# Patient Record
Sex: Female | Born: 1975 | Hispanic: Yes | Marital: Married | Smoking: Never smoker
Health system: Southern US, Community
[De-identification: ages and names within clinical notes are randomized; demographics above are authoritative.]

## PROBLEM LIST (undated history)

## (undated) DIAGNOSIS — K648 Other hemorrhoids: Secondary | ICD-10-CM

## (undated) DIAGNOSIS — K8020 Calculus of gallbladder without cholecystitis without obstruction: Secondary | ICD-10-CM

## (undated) DIAGNOSIS — T8859XA Other complications of anesthesia, initial encounter: Secondary | ICD-10-CM

## (undated) DIAGNOSIS — Z9889 Other specified postprocedural states: Secondary | ICD-10-CM

## (undated) DIAGNOSIS — A048 Other specified bacterial intestinal infections: Secondary | ICD-10-CM

## (undated) DIAGNOSIS — D649 Anemia, unspecified: Secondary | ICD-10-CM

## (undated) DIAGNOSIS — F419 Anxiety disorder, unspecified: Secondary | ICD-10-CM

## (undated) DIAGNOSIS — J45909 Unspecified asthma, uncomplicated: Secondary | ICD-10-CM

## (undated) DIAGNOSIS — C50919 Malignant neoplasm of unspecified site of unspecified female breast: Secondary | ICD-10-CM

## (undated) HISTORY — DX: Anemia, unspecified: D64.9

## (undated) HISTORY — DX: Unspecified asthma, uncomplicated: J45.909

## (undated) HISTORY — DX: Calculus of gallbladder without cholecystitis without obstruction: K80.20

## (undated) HISTORY — PX: MASTECTOMY: SHX3

## (undated) HISTORY — DX: Anxiety disorder, unspecified: F41.9

## (undated) HISTORY — PX: OTHER SURGICAL HISTORY: SHX169

## (undated) HISTORY — DX: Other hemorrhoids: K64.8

## (undated) HISTORY — DX: Other specified bacterial intestinal infections: A04.8

## (undated) HISTORY — PX: TUBAL LIGATION: SHX77

---

## 2019-07-20 ENCOUNTER — Other Ambulatory Visit: Payer: Self-pay

## 2019-07-20 ENCOUNTER — Emergency Department (HOSPITAL_BASED_OUTPATIENT_CLINIC_OR_DEPARTMENT_OTHER)
Admission: EM | Admit: 2019-07-20 | Discharge: 2019-07-21 | Disposition: A | Discharge status: Home / Self Care | Payer: Self-pay | Attending: Emergency Medicine | Admitting: Emergency Medicine

## 2019-07-20 ENCOUNTER — Emergency Department (HOSPITAL_BASED_OUTPATIENT_CLINIC_OR_DEPARTMENT_OTHER): Payer: Self-pay

## 2019-07-20 ENCOUNTER — Encounter (HOSPITAL_BASED_OUTPATIENT_CLINIC_OR_DEPARTMENT_OTHER): Payer: Self-pay | Admitting: Emergency Medicine

## 2019-07-20 DIAGNOSIS — R0789 Other chest pain: Secondary | ICD-10-CM | POA: Insufficient documentation

## 2019-07-20 DIAGNOSIS — M62830 Muscle spasm of back: Secondary | ICD-10-CM | POA: Insufficient documentation

## 2019-07-20 DIAGNOSIS — R079 Chest pain, unspecified: Secondary | ICD-10-CM

## 2019-07-20 HISTORY — DX: Malignant neoplasm of unspecified site of unspecified female breast: C50.919

## 2019-07-20 MED ORDER — KETOROLAC TROMETHAMINE 15 MG/ML IJ SOLN
15.0000 mg | Freq: Once | INTRAMUSCULAR | Status: AC
  Administered 2019-07-20: 15 mg via INTRAVENOUS
  Filled 2019-07-20: qty 1

## 2019-07-20 NOTE — ED Provider Notes (Signed)
[facility] DEPARTMENT Provider Note  CSN: [id number] Arrival date & time: 07/20/19 2311  Chief Complaint(s) Chest Pain  HPI Ann Andrade is a 44 y.o. female   CC: Chest pain  Onset/Duration: Sudden, 1 hour ago Timing: Constant, unchanged Location: Left upper chest/upper left back/shoulder Quality: Cramping/aching Severity: Severe Modifying Factors:  Improved by: Lying still  Worsened by: Palpation of the left upper chest and shoulder girdle muscles, range of motion of the left shoulder, breathing, movement Associated Signs/Symptoms:  Pertinent (+): None  Pertinent (-): Fevers, chills, nausea, vomiting, abdominal pain, shortness of breath, leg swelling. Context: Patient reports that the incident began after she stepped out of the shower and was bending down to put on lotion.  She reports feeling her chest and shoulder cramp.    HPI  Past Medical History Past Medical History:  Diagnosis Date   Breast cancer ([place])    There are no problems to display for this patient.  Home Medication(s) Prior to Admission medications   Not on File                                                                                                                                    Past Surgical History Past Surgical History:  Procedure Laterality Date   MASTECTOMY     Family History History reviewed. No pertinent family history.  Social History Social History   Tobacco Use   Smoking status: Never Smoker   Smokeless tobacco: Never Used  Substance Use Topics   Alcohol use: Not on file   Drug use: Not on file   Allergies Patient has no known allergies.  Review of Systems Review of Systems All other systems are reviewed and are negative for acute change except as noted in the HPI  Physical Exam Vital Signs  I have reviewed the triage vital signs BP 106/79 (BP Location: Right Arm)    Pulse 79    Temp 98.6 F (37 C) (Oral)    Resp (!) 22    Ht 5\' 4"   (1.626 m)    Wt 81.2 kg    LMP  (LMP Unknown)    SpO2 100%    BMI 30.73 kg/m   Physical Exam Vitals reviewed.  Constitutional:      General: She is not in acute distress.    Appearance: She is well-developed. She is not diaphoretic.  HENT:     Head: Normocephalic and atraumatic.     Nose: Nose normal.  Eyes:     General: No scleral icterus.       Right eye: No discharge.        Left eye: No discharge.     Conjunctiva/sclera: Conjunctivae normal.     Pupils: Pupils are equal, round, and reactive to light.  Cardiovascular:     Rate and Rhythm: Normal rate and regular rhythm.     Heart sounds: No murmur heard.  No  friction rub. No gallop.   Pulmonary:     Effort: Pulmonary effort is normal. No respiratory distress.     Breath sounds: Normal breath sounds. No stridor. No rales.  Chest:     Chest wall: Tenderness (exquisite) present.    Abdominal:     General: There is no distension.     Palpations: Abdomen is soft.     Tenderness: There is no abdominal tenderness.  Musculoskeletal:     Cervical back: Normal range of motion and neck supple. Spasms and tenderness present. No bony tenderness.       Back:  Skin:    General: Skin is warm and dry.     Findings: No erythema or rash.  Neurological:     Mental Status: She is alert and oriented to person, place, and time.     ED Results and Treatments Labs (all labs ordered are listed, but only abnormal results are displayed) Labs Reviewed - No data to display                                                                                                                       EKG  EKG Interpretation  Date/Time:  Saturday July 20 2019 23:13:25 EDT Ventricular Rate:  69 PR Interval:    QRS Duration: 82 QT Interval:  376 QTC Calculation: 403 R Axis:   53 Text Interpretation: Sinus rhythm Consider left atrial enlargement Abnormal R-wave progression, early transition NO STEMI. No old tracing to compare Confirmed by Ann Andrade [phone]) on 07/20/2019 11:21:31 PM      Radiology DG Chest 2 View  Result Date: 07/21/2019 CLINICAL DATA:  Acute onset of sternal chest pain. EXAM: CHEST - 2 VIEW COMPARISON:  None. FINDINGS: The cardiomediastinal contours are normal. The lungs are clear. Pulmonary vasculature is normal. No consolidation, pleural effusion, or pneumothorax. No acute osseous abnormalities are seen. IMPRESSION: Negative radiographs of the chest. Electronically Signed   By: Ann Andrade M.D.   On: 07/21/2019 00:04    Pertinent labs & imaging results that were available during my care of the patient were reviewed by me and considered in my medical decision making (see chart for details).  Medications Ordered in ED Medications  ketorolac (TORADOL) 15 MG/ML injection 15 mg (15 mg Intravenous Given 07/20/19 2354)  Procedures Procedures  (including critical care time)  Medical Decision Making / ED Course I have reviewed the nursing notes for this encounter and the patient's prior records (if available in EHR or on provided paperwork).   Ann Andrade was evaluated in Emergency Department on 07/21/2019 for the symptoms described in the history of present illness. She was evaluated in the context of the global COVID-19 pandemic, which necessitated consideration that the patient might be at risk for infection with the SARS-CoV-2 virus that causes COVID-19. Institutional protocols and algorithms that pertain to the evaluation of patients at risk for COVID-19 are in a state of rapid change based on information released by regulatory bodies including the CDC and federal and state organizations. These policies and algorithms were followed during the patient's care in the ED.  Sudden onset left upper chest/shoulder girdle pain most consistent with muscle spasm.  Highly consistent with ACS.  EKG  without acute ischemic changes or evidence of pericarditis.  Do not feel that cardiac markers are necessary at this time  Patient does have a history of prior breast cancer status post mastectomy which she reports was a complete resection without need for chemotherapy.  Patient is on Depo, no OCPs. No recent travel. No leg swelling or pain.  I have considered PE given her past history but feel this is highly unlikely given her current symptom constellation and presentation..  Chest x-ray without evidence suggestive of pneumonia, pneumothorax, pneumomediastinum.  No abnormal contour of the mediastinum to suggest dissection. No evidence of acute injuries.  Presentation not classic for aortic dissection or esophageal perforation.  We will treat for muscle spasm/strain with IV Toradol and heat packs.  Will reassess for improvement.  Patient had near complete resolution of her pain with the above treatment.     Final Clinical Impression(s) / ED Diagnoses Final diagnoses:  Chest pain   The patient appears reasonably screened and/or stabilized for discharge and I doubt any other medical condition or other [facility] requiring further screening, evaluation, or treatment in the ED at this time prior to discharge. Safe for discharge with strict return precautions.  Disposition: Discharge  Condition: Good  I have discussed the results, Dx and Tx plan with the patient/family who expressed understanding and agree(s) with the plan. Discharge instructions discussed at length. The patient/family was given strict return precautions who verbalized understanding of the instructions. No further questions at time of discharge.    ED Discharge Orders    None        Follow Up: Primary care provider  Schedule an appointment as soon as possible for a visit  If you do not have a primary care physician, contact [facility] at [phone] for referral      This chart was dictated using voice recognition  software.  Despite best efforts to proofread,  errors can occur which can change the documentation meaning.   Fatima Blank, MD 07/21/19 Ben[place]io Deeds

## 2019-07-20 NOTE — ED Triage Notes (Addendum)
Centralized non radiating chest pain started after getting out of the shower this evening.  Denies sob or cold symptoms.  Pt states pain is severe.  Pt does admit to having difficulty taking a deep breath.  No recent travel but is on depo.  Pt states she ate pizza for dinner at 7.

## 2019-07-21 NOTE — Discharge Instructions (Addendum)
Puede tomar Motrin (Ibuprofen) o Aleve (Naproxen), Acetaminophen (Tylenol), crema para los musculos como SalonPas, Icy Hot, [place]ert, Social research officer, government. Puede estirar, ponerce hielo o comprecion de calor, o que le den [place].

## 2020-09-18 ENCOUNTER — Other Ambulatory Visit: Payer: Self-pay

## 2020-09-18 ENCOUNTER — Emergency Department (HOSPITAL_BASED_OUTPATIENT_CLINIC_OR_DEPARTMENT_OTHER): Payer: Self-pay

## 2020-09-18 ENCOUNTER — Encounter (HOSPITAL_BASED_OUTPATIENT_CLINIC_OR_DEPARTMENT_OTHER): Payer: Self-pay | Admitting: *Deleted

## 2020-09-18 ENCOUNTER — Emergency Department (HOSPITAL_BASED_OUTPATIENT_CLINIC_OR_DEPARTMENT_OTHER)
Admission: EM | Admit: 2020-09-18 | Discharge: 2020-09-18 | Disposition: A | Discharge status: Home / Self Care | Payer: Self-pay | Attending: Emergency Medicine | Admitting: Emergency Medicine

## 2020-09-18 DIAGNOSIS — N3000 Acute cystitis without hematuria: Secondary | ICD-10-CM | POA: Insufficient documentation

## 2020-09-18 DIAGNOSIS — M546 Pain in thoracic spine: Secondary | ICD-10-CM | POA: Insufficient documentation

## 2020-09-18 DIAGNOSIS — D72829 Elevated white blood cell count, unspecified: Secondary | ICD-10-CM | POA: Insufficient documentation

## 2020-09-18 DIAGNOSIS — Z20822 Contact with and (suspected) exposure to covid-19: Secondary | ICD-10-CM | POA: Insufficient documentation

## 2020-09-18 DIAGNOSIS — Z853 Personal history of malignant neoplasm of breast: Secondary | ICD-10-CM | POA: Insufficient documentation

## 2020-09-18 LAB — CBC WITH DIFFERENTIAL/PLATELET
Abs Immature Granulocytes: 0.02 10*3/uL (ref 0.00–0.07)
Basophils Absolute: 0 10*3/uL (ref 0.0–0.1)
Basophils Relative: 0 %
Eosinophils Absolute: 0.1 10*3/uL (ref 0.0–0.5)
Eosinophils Relative: 2 %
HCT: 36.4 % (ref 36.0–46.0)
Hemoglobin: 12.4 g/dL (ref 12.0–15.0)
Immature Granulocytes: 0 %
Lymphocytes Relative: 24 %
Lymphs Abs: 1.1 10*3/uL (ref 0.7–4.0)
MCH: 30.6 pg (ref 26.0–34.0)
MCHC: 34.1 g/dL (ref 30.0–36.0)
MCV: 89.9 fL (ref 80.0–100.0)
Monocytes Absolute: 0.5 10*3/uL (ref 0.1–1.0)
Monocytes Relative: 10 %
Neutro Abs: 2.9 10*3/uL (ref 1.7–7.7)
Neutrophils Relative %: 64 %
Platelets: 186 10*3/uL (ref 150–400)
RBC: 4.05 MIL/uL (ref 3.87–5.11)
RDW: 11.3 % — ABNORMAL LOW (ref 11.5–15.5)
WBC: 4.6 10*3/uL (ref 4.0–10.5)
nRBC: 0 % (ref 0.0–0.2)

## 2020-09-18 LAB — COMPREHENSIVE METABOLIC PANEL
ALT: 16 U/L (ref 0–44)
AST: 18 U/L (ref 15–41)
Albumin: 3.7 g/dL (ref 3.5–5.0)
Alkaline Phosphatase: 45 U/L (ref 38–126)
Anion gap: 6 (ref 5–15)
BUN: 12 mg/dL (ref 6–20)
CO2: 23 mmol/L (ref 22–32)
Calcium: 8.9 mg/dL (ref 8.9–10.3)
Chloride: 107 mmol/L (ref 98–111)
Creatinine, Ser: 0.54 mg/dL (ref 0.44–1.00)
GFR, Estimated: >60 mL/min (ref >60)
Glucose, Bld: 99 mg/dL (ref 70–99)
Potassium: 4 mmol/L (ref 3.5–5.1)
Sodium: 136 mmol/L (ref 135–145)
Total Bilirubin: 0.4 mg/dL (ref 0.3–1.2)
Total Protein: 6.8 g/dL (ref 6.5–8.1)

## 2020-09-18 LAB — LIPASE, BLOOD: Lipase: 45 U/L (ref 11–51)

## 2020-09-18 LAB — RESP PANEL BY RT-PCR (FLU A&B, COVID) ARPGX2
Influenza A by PCR: NEGATIVE
Influenza B by PCR: NEGATIVE
SARS Coronavirus 2 by RT PCR: NEGATIVE

## 2020-09-18 LAB — URINALYSIS, ROUTINE W REFLEX MICROSCOPIC
Bilirubin Urine: NEGATIVE
Glucose, UA: NEGATIVE mg/dL
Ketones, ur: NEGATIVE mg/dL
Nitrite: NEGATIVE
Protein, ur: NEGATIVE mg/dL
Specific Gravity, Urine: 1.015 (ref 1.005–1.030)
pH: 8 (ref 5.0–8.0)

## 2020-09-18 LAB — PREGNANCY, URINE: Preg Test, Ur: NEGATIVE

## 2020-09-18 LAB — URINALYSIS, MICROSCOPIC (REFLEX)

## 2020-09-18 MED ORDER — IOHEXOL 350 MG/ML SOLN
100.0000 mL | Freq: Once | INTRAVENOUS | Status: AC | PRN
  Administered 2020-09-18: 100 mL via INTRAVENOUS

## 2020-09-18 MED ORDER — ACETAMINOPHEN 500 MG PO TABS
1000.0000 mg | ORAL_TABLET | Freq: Once | ORAL | Status: AC
  Administered 2020-09-18: 1000 mg via ORAL
  Filled 2020-09-18: qty 2

## 2020-09-18 MED ORDER — CEPHALEXIN 500 MG PO CAPS
500.0000 mg | ORAL_CAPSULE | Freq: Two times a day (BID) | ORAL | 0 refills | Status: AC
Start: 2020-09-18 — End: 2020-09-25

## 2020-09-18 NOTE — ED Notes (Signed)
Patient transported to CT 

## 2020-09-18 NOTE — ED Provider Notes (Signed)
[facility] Provider Note   CSN: [id number] Arrival date & time: 09/18/20  1805     History Chief Complaint  Patient presents with   URI    Ann Andrade is a 45 y.o. female with a history of breast cancer.  Patient reports that breast cancer occurred 22 years prior, and is not receiving any chemotherapy or radiation.  Presents with a chief complaint of body aches, chills, and lower abdominal pain.  Patient reports that her symptoms started yesterday.  Patient reports that abdominal pain has been constant.  Patient rates pain 10/10 on the pain scale.  Patient denies any aggravating factors.  Patient has had minimal relief with ibuprofen.  Additionally patient complains of thoracic back pain.  Patient denies any recent falls, injuries, or heavy lifting.  Patient endorses subjective fevers, urinary frequency, and nausea.  Patient denies any rhinorrhea, nasal congestion, sore throat, cough, shortness of breath, chest pain, blood in stool, melena, constipation, diarrhea, dysuria, hematuria, vaginal pain, vaginal bleeding, vaginal discharge, genital sores or lesions.  Patient reports that she does not get regular menstrual periods due to Depo-Provera shot.  Patient is sexually active in a mutually monogamous relationship with a female partner.  Patient was offered Spanish interpreter for this interview however declined.   URI Presenting symptoms: fever (Subjective)   Presenting symptoms: no congestion, no rhinorrhea and no sore throat   Associated symptoms: myalgias (Generalized)   Associated symptoms: no headaches and no neck pain       Past Medical History:  Diagnosis Date   Breast cancer ([place])     There are no problems to display for this patient.   Past Surgical History:  Procedure Laterality Date   MASTECTOMY       OB History   No obstetric history on file.     No family history on file.  Social History   Tobacco Use   Smoking status:  Never   Smokeless tobacco: Never  Vaping Use   Vaping Use: Never used  Substance Use Topics   Alcohol use: Never   Drug use: Never    Home Medications Prior to Admission medications   Not on File    Allergies    Patient has no known allergies.  Review of Systems   Review of Systems  Constitutional:  Positive for chills and fever (Subjective).  HENT:  Negative for congestion, rhinorrhea and sore throat.   Eyes:  Negative for visual disturbance.  Respiratory:  Negative for shortness of breath.   Cardiovascular:  Negative for chest pain.  Gastrointestinal:  Positive for abdominal pain and nausea. Negative for abdominal distention, anal bleeding, blood in stool, constipation, diarrhea, rectal pain and vomiting.  Genitourinary:  Positive for frequency and urgency. Negative for difficulty urinating, dysuria, flank pain, genital sores, hematuria, pelvic pain, vaginal bleeding, vaginal discharge and vaginal pain.  Musculoskeletal:  Positive for back pain and myalgias (Generalized). Negative for neck pain.  Skin:  Negative for color change and rash.  Neurological:  Negative for dizziness, syncope, light-headedness and headaches.  Psychiatric/Behavioral:  Negative for confusion.    Physical Exam Updated Vital Signs BP 98/67 (BP Location: Right Arm)   Pulse 66   Temp 98.2 F (36.8 C) (Oral)   Resp 16   Ht '5\' 4"'$  (1.626 m)   Wt 81.6 kg   SpO2 100%   BMI 30.90 kg/m   Physical Exam Vitals and nursing note reviewed.  Constitutional:      General: She is  not in acute distress.    Appearance: She is not ill-appearing, toxic-appearing or diaphoretic.  HENT:     Head: Normocephalic.  Eyes:     General: No scleral icterus.       Right eye: No discharge.        Left eye: No discharge.  Cardiovascular:     Rate and Rhythm: Normal rate.  Pulmonary:     Effort: Pulmonary effort is normal. No tachypnea, bradypnea or respiratory distress.     Breath sounds: Normal breath sounds. No  stridor.  Abdominal:     General: A surgical scar is present. Bowel sounds are normal. There is no distension. There are no signs of injury.     Palpations: Abdomen is soft. There is no mass or pulsatile mass.     Tenderness: There is abdominal tenderness. There is no right CVA tenderness, left CVA tenderness, guarding or rebound.     Hernia: There is no hernia in the umbilical area.     Comments: Mild lower abdominal tenderness with no peritoneal signs.  Well-healed midline surgical scar.  Musculoskeletal:     Cervical back: Normal.     Thoracic back: Tenderness present. No swelling, edema, deformity, signs of trauma, spasms or bony tenderness.     Lumbar back: No swelling, edema, deformity, signs of trauma, lacerations, spasms, tenderness or bony tenderness.     Comments: No midline tenderness or deformity to cervical, thoracic, or lumbar spine.  Patient has tenderness to bilateral thoracic back.  Skin:    General: Skin is warm and dry.  Neurological:     General: No focal deficit present.     Mental Status: She is alert.  Psychiatric:        Behavior: Behavior is cooperative.    ED Results / Procedures / Treatments   Labs (all labs ordered are listed, but only abnormal results are displayed) Labs Reviewed  URINALYSIS, ROUTINE W REFLEX MICROSCOPIC - Abnormal; Notable for the following components:      Result Value   APPearance HAZY (*)    Hgb urine dipstick SMALL (*)    Leukocytes,Ua MODERATE (*)    All other components within normal limits  CBC WITH DIFFERENTIAL/PLATELET - Abnormal; Notable for the following components:   RDW 11.3 (*)    All other components within normal limits  URINALYSIS, MICROSCOPIC (REFLEX) - Abnormal; Notable for the following components:   Bacteria, UA RARE (*)    All other components within normal limits  RESP PANEL BY RT-PCR (FLU A&B, COVID) ARPGX2  PREGNANCY, URINE  COMPREHENSIVE METABOLIC PANEL  LIPASE, BLOOD    EKG None  Radiology CT  ABDOMEN PELVIS W CONTRAST  Result Date: 09/18/2020 CLINICAL DATA:  Diverticulitis, rhinorrhea, chills, myalgia EXAM: CT ABDOMEN AND PELVIS WITH CONTRAST TECHNIQUE: Multidetector CT imaging of the abdomen and pelvis was performed using the standard protocol following bolus administration of intravenous contrast. CONTRAST:  174m OMNIPAQUE IOHEXOL 350 MG/ML SOLN COMPARISON:  None. FINDINGS: Lower chest: No acute abnormality. Hepatobiliary: Cholelithiasis without pericholecystic inflammatory change. Mild focal fatty hepatic infiltration adjacent to the falciform ligament. No enhancing intrahepatic mass. No intra or extrahepatic biliary ductal dilation. Pancreas: Unremarkable Spleen: Unremarkable Adrenals/Urinary Tract: Adrenal glands are unremarkable. Kidneys are normal, without renal calculi, focal lesion, or hydronephrosis. The bladder is decompressed. There is circumferential bladder wall thickening, however, which may reflect a superimposed diffuse infectious or inflammatory cystitis. Stomach/Bowel: Stomach is within normal limits. Appendix appears normal. No evidence of bowel wall thickening, distention, or inflammatory  changes. No free intraperitoneal gas or fluid. Vascular/Lymphatic: No significant vascular findings are present. No enlarged abdominal or pelvic lymph nodes. Reproductive: Uterus and bilateral adnexa are unremarkable. Other: Surgical changes of a left breast TRAM flap reconstruction is identified. A 2.8 x 4.5 cm loculated fluid collection is seen within the anterior abdominal wall likely representing a postoperative seroma. No abdominal wall hernia identified. Rectum unremarkable. Musculoskeletal: No acute bone abnormality. No lytic or blastic bone lesions. IMPRESSION: Circumferential bladder wall thickening, possibly reflecting a diffuse infectious or inflammatory cystitis. Correlation with urinalysis and urine culture may be helpful. The bladder is not distended. Cholelithiasis. Surgical changes  of left breast TRAM flap reconstruction with probable 4.5 cm postoperative seroma within the anterior abdominal wall. Electronically Signed   By: Fidela Salisbury M.D.   On: 09/18/2020 22:51    Procedures Procedures   Medications Ordered in ED Medications  acetaminophen (TYLENOL) tablet 1,000 mg (1,000 mg Oral Given 09/18/20 2010)  iohexol (OMNIPAQUE) 350 MG/ML injection 100 mL (100 mLs Intravenous Contrast Given 09/18/20 2224)    ED Course  I have reviewed the triage vital signs and the nursing notes.  Pertinent labs & imaging results that were available during my care of the patient were reviewed by me and considered in my medical decision making (see chart for details).    MDM Rules/Calculators/A&P                           Alert 45 year old female no acute distress, nontoxic appearing.  Presents with chief complaint of chills, generalized myalgias, lower abdominal pain, and thoracic back pain.  Patient endorses associated urinary frequency and nausea.  Abdomen soft, nondistended, mild tenderness to lower abdomen with no peritoneal signs.  Negative CVA tenderness bilaterally.  Due to patient's reports of chills, subjective fever, and generalized myalgias we will swab patient for COVID-19 and influenza.  Respiratory panel negative for COVID-19 and influenza.  The patient's abdominal tenderness, thoracic back pain, and urinary frequency concern for pyelonephritis, renal calculus, appendicitis, diverticulitis.  Will obtain CMP, CBC, lipase, urinalysis, urine pregnancy test.  Urine pregnancy test negative Lipase within normal limits CMP and CBC unremarkable UA shows bacteria rare, WBC 21-50, leukocytes moderate.  On repeat examination patient reports improvement in her pain after receiving Tylenol however still has mild tenderness to lower abdomen.  Will obtain CT imaging.  CT imaging shows cholelithiasis, circumferential bladder wall thickening, and 4.5 cm postoperative seroma within  the anterior abdominal wall.  Low suspicion for cholangitis as patient is afebrile, no inflammatory changes on CT imaging, LFTs within normal limits.  Low suspicion for seroma as cause of patient's abdominal pain is surgery is reported to be 22 years prior.  Due to circumferential bladder wall thickening and urinalysis findings combined with patient's urinary frequency and urgency suspect possible urinary tract infection.  Suspicion for pyelonephritis as no abnormalities are seen to kidneys on CT imaging.  Will start patient on 7-day course of Keflex.  Patient to follow-up with primary care provider as needed.  The patient appears stable so that the remainder of the work up may be completed by another provider.         Final Clinical Impression(s) / ED Diagnoses Final diagnoses:  Acute cystitis without hematuria    Rx / DC Orders ED Discharge Orders          Ordered    cephALEXin (KEFLEX) 500 MG capsule  2 times daily  09/18/20 2320             Loni Beckwith, PA-C 09/19/20 0127    Charlesetta Shanks, MD 09/19/20 1442

## 2020-09-18 NOTE — ED Notes (Signed)
Patient left ED with ABCs intact, alert and oriented x4, respirations even and unlabored. Discharge instructions reviewed and all questions answered.   

## 2020-09-18 NOTE — ED Triage Notes (Signed)
Runny nose, chills, body aches since yesterday. She took Ibuprofen today.

## 2020-09-18 NOTE — Discharge Instructions (Addendum)
The findings of your CT scan and urinalysis are concerning for possible urinary tract infection.  Due to this she was started on the antibiotic Keflex.  Please take this medication as prescribed.  Please follow-up with your primary care provider if your symptoms do not improve.  Get help right away if: You have worsening pain in your back or your lower abdomen. You have a fever or chills. You have nausea or vomiting.

## 2021-01-11 ENCOUNTER — Emergency Department (HOSPITAL_BASED_OUTPATIENT_CLINIC_OR_DEPARTMENT_OTHER)
Admission: EM | Admit: 2021-01-11 | Discharge: 2021-01-11 | Disposition: A | Discharge status: Home / Self Care | Payer: Medicaid Other | Attending: Emergency Medicine | Admitting: Emergency Medicine

## 2021-01-11 ENCOUNTER — Encounter (HOSPITAL_BASED_OUTPATIENT_CLINIC_OR_DEPARTMENT_OTHER): Payer: Self-pay | Admitting: *Deleted

## 2021-01-11 ENCOUNTER — Other Ambulatory Visit: Payer: Self-pay

## 2021-01-11 DIAGNOSIS — R300 Dysuria: Secondary | ICD-10-CM | POA: Insufficient documentation

## 2021-01-11 DIAGNOSIS — K921 Melena: Secondary | ICD-10-CM | POA: Insufficient documentation

## 2021-01-11 DIAGNOSIS — Z853 Personal history of malignant neoplasm of breast: Secondary | ICD-10-CM | POA: Insufficient documentation

## 2021-01-11 LAB — PROTIME-INR
INR: 1 (ref 0.8–1.2)
Prothrombin Time: 12.7 seconds (ref 11.4–15.2)

## 2021-01-11 LAB — CBC WITH DIFFERENTIAL/PLATELET
Abs Immature Granulocytes: 0.02 10*3/uL (ref 0.00–0.07)
Basophils Absolute: 0 10*3/uL (ref 0.0–0.1)
Basophils Relative: 1 %
Eosinophils Absolute: 0.3 10*3/uL (ref 0.0–0.5)
Eosinophils Relative: 5 %
HCT: 37.6 % (ref 36.0–46.0)
Hemoglobin: 12.9 g/dL (ref 12.0–15.0)
Immature Granulocytes: 0 %
Lymphocytes Relative: 39 %
Lymphs Abs: 2.5 10*3/uL (ref 0.7–4.0)
MCH: 30.2 pg (ref 26.0–34.0)
MCHC: 34.3 g/dL (ref 30.0–36.0)
MCV: 88.1 fL (ref 80.0–100.0)
Monocytes Absolute: 0.5 10*3/uL (ref 0.1–1.0)
Monocytes Relative: 7 %
Neutro Abs: 3.2 10*3/uL (ref 1.7–7.7)
Neutrophils Relative %: 48 %
Platelets: 259 10*3/uL (ref 150–400)
RBC: 4.27 MIL/uL (ref 3.87–5.11)
RDW: 11.7 % (ref 11.5–15.5)
WBC: 6.5 10*3/uL (ref 4.0–10.5)
nRBC: 0 % (ref 0.0–0.2)

## 2021-01-11 LAB — URINALYSIS, ROUTINE W REFLEX MICROSCOPIC
Bilirubin Urine: NEGATIVE
Glucose, UA: NEGATIVE mg/dL
Ketones, ur: NEGATIVE mg/dL
Leukocytes,Ua: NEGATIVE
Nitrite: NEGATIVE
Protein, ur: NEGATIVE mg/dL
Specific Gravity, Urine: 1.025 (ref 1.005–1.030)
pH: 7 (ref 5.0–8.0)

## 2021-01-11 LAB — URINALYSIS, MICROSCOPIC (REFLEX): WBC, UA: NONE SEEN WBC/hpf (ref 0–5)

## 2021-01-11 LAB — COMPREHENSIVE METABOLIC PANEL
ALT: 21 U/L (ref 0–44)
AST: 22 U/L (ref 15–41)
Albumin: 3.8 g/dL (ref 3.5–5.0)
Alkaline Phosphatase: 53 U/L (ref 38–126)
Anion gap: 8 (ref 5–15)
BUN: 22 mg/dL — ABNORMAL HIGH (ref 6–20)
CO2: 22 mmol/L (ref 22–32)
Calcium: 8.8 mg/dL — ABNORMAL LOW (ref 8.9–10.3)
Chloride: 106 mmol/L (ref 98–111)
Creatinine, Ser: 0.74 mg/dL (ref 0.44–1.00)
GFR, Estimated: >60 mL/min (ref >60)
Glucose, Bld: 104 mg/dL — ABNORMAL HIGH (ref 70–99)
Potassium: 3.5 mmol/L (ref 3.5–5.1)
Sodium: 136 mmol/L (ref 135–145)
Total Bilirubin: 0.4 mg/dL (ref 0.3–1.2)
Total Protein: 7.3 g/dL (ref 6.5–8.1)

## 2021-01-11 LAB — OCCULT BLOOD X 1 CARD TO LAB, STOOL: Fecal Occult Bld: POSITIVE — AB

## 2021-01-11 MED ORDER — PANTOPRAZOLE SODIUM 20 MG PO TBEC: 20.0000 mg | DELAYED_RELEASE_TABLET | Freq: Every day | ORAL | 0 refills | Status: DC

## 2021-01-11 MED ORDER — PANTOPRAZOLE SODIUM 40 MG IV SOLR
40.0000 mg | Freq: Once | INTRAVENOUS | Status: AC
  Administered 2021-01-11: 23:00:00 40 mg via INTRAVENOUS
  Filled 2021-01-11: qty 40

## 2021-01-11 NOTE — ED Provider Notes (Signed)
[facility] EMERGENCY DEPARTMENT Provider Note   CSN: [id number] Arrival date & time: 01/11/21  1825     History Chief Complaint  Patient presents with   Rectal Bleeding    Ann Andrade is a 45 y.o. female.  The history is provided by the patient. The history is limited by a language barrier. A language interpreter was used.  Rectal Bleeding Associated symptoms: no abdominal pain, no dizziness, no fever, no light-headedness and no vomiting   Patient presents for dark stool since yesterday.  She denies any history of GI bleeding.  She denies any associated pain with defecation.  She has had recent dysuria.  She has not had any other areas of discomfort.  Dark stools continued today.  For this reason, she presents to the ED.  She denies any recent lightheadedness, dizziness, near syncope, or exertional shortness of breath.  She is not on any blood thinning medications.    Past Medical History:  Diagnosis Date   Breast cancer ([place])     There are no problems to display for this patient.   Past Surgical History:  Procedure Laterality Date   MASTECTOMY       OB History   No obstetric history on file.     No family history on file.  Social History   Tobacco Use   Smoking status: Never   Smokeless tobacco: Never  Vaping Use   Vaping Use: Never used  Substance Use Topics   Alcohol use: Never   Drug use: Never    Home Medications Prior to Admission medications   Medication Sig Start Date End Date Taking? Authorizing Provider  citalopram (CELEXA) 20 MG tablet Take 10 mg by mouth daily.   Yes [provider]  pantoprazole (PROTONIX) 20 MG tablet Take 1 tablet (20 mg total) by mouth daily for 14 days. 01/11/21 01/25/21 Yes Godfrey Pick, MD    Allergies    Patient has no known allergies.  Review of Systems   Review of Systems  Constitutional:  Negative for activity change, appetite change, chills, fatigue and fever.  HENT:  Negative for congestion,  ear pain and sore throat.   Eyes:  Negative for pain and visual disturbance.  Respiratory:  Negative for cough and shortness of breath.   Cardiovascular:  Negative for chest pain and palpitations.  Gastrointestinal:  Positive for hematochezia. Negative for abdominal distention, abdominal pain, constipation, diarrhea, nausea and vomiting.       Dark stools  Genitourinary:  Positive for dysuria. Negative for flank pain, frequency, hematuria, pelvic pain and urgency.  Musculoskeletal:  Negative for arthralgias, back pain, myalgias and neck pain.  Skin:  Negative for color change and rash.  Neurological:  Negative for dizziness, tremors, seizures, syncope, speech difficulty, weakness, light-headedness and numbness.  Hematological:  Does not bruise/bleed easily.  Psychiatric/Behavioral:  Negative for confusion and decreased concentration.   All other systems reviewed and are negative.  Physical Exam Updated Vital Signs BP 109/75    Pulse 78    Temp 98.3 F (36.8 C) (Oral)    Resp 18    Ht 5\' 4"  (1.626 m)    Wt 81.6 kg    SpO2 100%    BMI 30.88 kg/m   Physical Exam Vitals and nursing note reviewed. Exam conducted with a chaperone present.  Constitutional:      General: She is not in acute distress.    Appearance: Normal appearance. She is well-developed and normal weight. She is not ill-appearing, toxic-appearing  or diaphoretic.  HENT:     Head: Normocephalic and atraumatic.     Right Ear: External ear normal.     Left Ear: External ear normal.     Nose: Nose normal.     Mouth/Throat:     Mouth: Mucous membranes are moist.     Pharynx: Oropharynx is clear.  Eyes:     General: No scleral icterus.    Extraocular Movements: Extraocular movements intact.     Conjunctiva/sclera: Conjunctivae normal.  Cardiovascular:     Rate and Rhythm: Normal rate and regular rhythm.     Heart sounds: No murmur heard. Pulmonary:     Effort: Pulmonary effort is normal. No respiratory distress.      Breath sounds: Normal breath sounds.  Abdominal:     Palpations: Abdomen is soft.     Tenderness: There is no abdominal tenderness. There is no right CVA tenderness or left CVA tenderness.  Genitourinary:    Rectum: Normal. Guaiac result positive.  Musculoskeletal:        General: No swelling.     Cervical back: Normal range of motion and neck supple. No rigidity.     Right lower leg: No edema.     Left lower leg: No edema.  Skin:    General: Skin is warm and dry.     Capillary Refill: Capillary refill takes less than 2 seconds.     Coloration: Skin is not jaundiced or pale.  Neurological:     General: No focal deficit present.     Mental Status: She is alert and oriented to person, place, and time.     Cranial Nerves: No cranial nerve deficit.     Sensory: No sensory deficit.     Motor: No weakness.  Psychiatric:        Mood and Affect: Mood normal.        Behavior: Behavior normal.        Thought Content: Thought content normal.        Judgment: Judgment normal.    ED Results / Procedures / Treatments   Labs (all labs ordered are listed, but only abnormal results are displayed) Labs Reviewed  COMPREHENSIVE METABOLIC PANEL - Abnormal; Notable for the following components:      Result Value   Glucose, Bld 104 (*)    BUN 22 (*)    Calcium 8.8 (*)    All other components within normal limits  OCCULT BLOOD X 1 CARD TO LAB, STOOL - Abnormal; Notable for the following components:   Fecal Occult Bld POSITIVE (*)    All other components within normal limits  URINALYSIS, ROUTINE W REFLEX MICROSCOPIC - Abnormal; Notable for the following components:   Hgb urine dipstick TRACE (*)    All other components within normal limits  URINALYSIS, MICROSCOPIC (REFLEX) - Abnormal; Notable for the following components:   Bacteria, UA RARE (*)    All other components within normal limits  CBC WITH DIFFERENTIAL/PLATELET  PROTIME-INR    EKG None  Radiology No results  found.  Procedures Procedures   Medications Ordered in ED Medications  pantoprazole (PROTONIX) injection 40 mg (40 mg Intravenous Given 01/11/21 2300)    ED Course  I have reviewed the triage vital signs and the nursing notes.  Pertinent labs & imaging results that were available during my care of the patient were reviewed by me and considered in my medical decision making (see chart for details).    MDM Rules/Calculators/A&P  Patient is a healthy 45 year old female presenting for dark stools since yesterday, continuing to today.  She denies any recent symptoms of anemia.  Vital signs are normal upon arrival.  Patient is well-appearing.  DRE was performed with nurse chaperone present.  No masses, fissures, or hemorrhoids identified.  Guaiac result was positive.  CBC shows no drop in hemoglobin.  I do not suspect a clinically significant bleed.  Additionally, urinalysis showed no evidence of infection.  Given that the patient describes melanotic stool, upper GI source is suspected.  Patient was given Protonix in the ED. She was advised to follow-up with gastroenterology for further assessment.  She was prescribed Protonix to take at home.  She was discharged in good condition.  Final Clinical Impression(s) / ED Diagnoses Final diagnoses:  Melena    Rx / DC Orders ED Discharge Orders          Ordered    pantoprazole (PROTONIX) 20 MG tablet  Daily        01/11/21 2320             Godfrey Pick, MD 01/13/21 0120

## 2021-01-11 NOTE — ED Triage Notes (Signed)
Bright red rectal bleeding since yesterday. She states she has been constipated.

## 2021-01-11 NOTE — ED Notes (Signed)
Discharge instructions discussed with pt. Pt verbalized understanding. Pt stable and ambulatory. No signature pad available. 

## 2021-01-12 ENCOUNTER — Encounter: Payer: Self-pay | Admitting: Gastroenterology

## 2021-02-16 ENCOUNTER — Ambulatory Visit (INDEPENDENT_AMBULATORY_CARE_PROVIDER_SITE_OTHER): Payer: Self-pay | Admitting: Gastroenterology

## 2021-02-16 ENCOUNTER — Other Ambulatory Visit: Payer: Self-pay

## 2021-02-16 ENCOUNTER — Encounter: Payer: Self-pay | Admitting: Gastroenterology

## 2021-02-16 VITALS — BP 108/74 | HR 82 | Ht 64.0 in | Wt 189.1 lb

## 2021-02-16 DIAGNOSIS — K921 Melena: Secondary | ICD-10-CM

## 2021-02-16 DIAGNOSIS — K625 Hemorrhage of anus and rectum: Secondary | ICD-10-CM

## 2021-02-16 DIAGNOSIS — K581 Irritable bowel syndrome with constipation: Secondary | ICD-10-CM

## 2021-02-16 MED ORDER — CLENPIQ 10-3.5-12 MG-GM -GM/160ML PO SOLN: 1.0000 | ORAL | 0 refills | Status: DC

## 2021-02-16 MED ORDER — PANTOPRAZOLE SODIUM 40 MG PO TBEC: 40.0000 mg | DELAYED_RELEASE_TABLET | Freq: Every day | ORAL | 6 refills | Status: DC

## 2021-02-16 MED ORDER — HYDROCORTISONE (PERIANAL) 2.5 % EX CREA: 1.0000 "application " | TOPICAL_CREAM | Freq: Two times a day (BID) | CUTANEOUS | 4 refills | Status: DC

## 2021-02-16 NOTE — Progress Notes (Signed)
Chief Complaint:   Referring Provider:  [facility]      ASSESSMENT AND PLAN;   #1. Rectal bleeding  #2. IBS-C, failed miralax  #3. ?Melena. H/O N/V. Doubt biliary colic. Nl CBC, CMP, lipase  Plan: -proronix 40mg  po QD #30, 6 RF -EGD/colon -MOM 15 cc po QD -HC 2.5% BID PR x 10 days. 4 RF   I discussed EGD/Colonoscopy- the indications, risks, alternatives and potential complications including, but not limited to, bleeding, infection, reaction to medication, damage to internal organs, cardiac and/or pulmonary problems, and perforation requiring surgery. The possibility that significant findings could be missed was explained. All ? were answered. The patient gives consent to proceed.  HPI:    Ann Andrade is a 46 y.o. female  With H/O breast cancer  With ?  History of melena with intermittent N/V.  Associated with abdominal pain.  Had normal CBC.  Underwent CT Abdo/pelvis as below which did not show any acute abnormalities.  She also had normal CMP and lipase.  Longstanding history of constipation despite MiraLAX daily and mineral oil as needed.  Also has been complaining of occasional bright red blood per rectum and rectal pain when she has bowel movements.  Denies having any urgency.  No recent weight loss or loss of appetite.  No nonsteroidals.  I have reviewed her last CT with the patient as well.   Previous GI work-up: CT AP with contrast 09/18/2020 IMPRESSION: Circumferential bladder wall thickening,  Cholelithiasis. 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 Past Medical History:  Diagnosis Date   Breast cancer [facility])     Past Surgical History:  Procedure Laterality Date   MASTECTOMY Left     Family History  Problem Relation Age of Onset   Colon cancer Neg Hx    Esophageal cancer Neg Hx     Social History   Tobacco Use   Smoking status:  Never   Smokeless tobacco: Never  Vaping Use   Vaping Use: Never used  Substance Use Topics   Alcohol use: Never   Drug use: Never    Current Outpatient Medications  Medication Sig Dispense Refill   Albuterol Sulfate (PROAIR HFA IN) Inhale into the lungs.     citalopram (CELEXA) 20 MG tablet Take 10 mg by mouth daily.     levocetirizine (XYZAL) 5 MG tablet Take 5 mg by mouth every evening.     No current facility-administered medications for this visit.    No Known Allergies  Review of Systems:  Constitutional: Denies fever, chills, diaphoresis, appetite change and fatigue.  HEENT: Denies photophobia, eye pain, redness, hearing loss, ear pain, congestion, sore throat, rhinorrhea, sneezing, mouth sores, neck pain, neck stiffness and tinnitus.   Respiratory: Denies SOB, DOE, cough, chest tightness,  and wheezing.   Cardiovascular: Denies chest pain, palpitations and leg swelling.  Genitourinary: Denies dysuria, urgency, frequency, hematuria, flank pain and difficulty urinating.  Musculoskeletal: Denies myalgias, back pain, joint swelling, arthralgias and gait problem.  Skin: No rash.  Neurological: Denies dizziness, seizures, syncope, weakness, light-headedness, numbness and headaches.  Hematological: Denies adenopathy. Easy bruising, personal or family bleeding history  Psychiatric/Behavioral: No anxiety or depression     Physical Exam:    BP 108/74    Pulse 82    Ht 5\' 4"  (1.626 m)    Wt 189 lb 2 oz (85.8 kg)    BMI 32.46 kg/m  Wt  Readings from Last 3 Encounters:  02/16/21 189 lb 2 oz (85.8 kg)  01/11/21 179 lb 14.3 oz (81.6 kg)  09/18/20 180 lb (81.6 kg)   Constitutional:  Well-developed, in no acute distress. Psychiatric: Normal mood and affect. Behavior is normal. HEENT: Pupils normal.  Conjunctivae are normal. No scleral icterus. Neck supple.  Cardiovascular: Normal rate, regular rhythm. No edema Pulmonary/chest: Effort normal and breath sounds normal. No wheezing,  rales or rhonchi. Abdominal: Soft, nondistended. Nontender. Bowel sounds active throughout. There are no masses palpable. No hepatomegaly. Rectal: Deferred.  To be performed at the time of colonoscopy. Neurological: Alert and oriented to person place and time. Skin: Skin is warm and dry. No rashes noted.  Data Reviewed: I have personally reviewed following labs and imaging studies  CBC: CBC Latest Ref Rng & Units 01/11/2021 09/18/2020  WBC 4.0 - 10.5 K/uL 6.5 4.6  Hemoglobin 12.0 - 15.0 g/dL 12.9 12.4  Hematocrit 36.0 - 46.0 % 37.6 36.4  Platelets 150 - 400 K/uL 259 186    CMP: CMP Latest Ref Rng & Units 01/11/2021 09/18/2020  Glucose 70 - 99 mg/dL 104(H) 99  BUN 6 - 20 mg/dL 22(H) 12  Creatinine 0.44 - 1.00 mg/dL 0.74 0.54  Sodium 135 - 145 mmol/L 136 136  Potassium 3.5 - 5.1 mmol/L 3.5 4.0  Chloride 98 - 111 mmol/L 106 107  CO2 22 - 32 mmol/L 22 23  Calcium 8.9 - 10.3 mg/dL 8.8(L) 8.9  Total Protein 6.5 - 8.1 g/dL 7.3 6.8  Total Bilirubin 0.3 - 1.2 mg/dL 0.4 0.4  Alkaline Phos 38 - 126 U/L 53 45  AST 15 - 41 U/L 22 18  ALT 0 - 44 U/L 21 16        Carmell Austria, MD 02/16/2021, 10:20 AM  C[facility]: [facility]

## 2021-02-16 NOTE — Patient Instructions (Addendum)
If you are age 44 or younger, your body mass index should be between 19-25. Your Body mass index is 32.46 kg/m. If this is out of the aformentioned range listed, please consider follow up with your Primary Care Provider.   __________________________________________________________  The [place] [facility] providers would like to encourage you to use [facility] to communicate with providers for non-urgent requests or questions.  Due to long hold times on the telephone, sending your provider a message by [facility] may be a faster and more efficient way to get a response.  Please allow 48 business hours for a response.  Please remember that this is for non-urgent requests.    Due to recent changes in healthcare laws, you may see the results of your imaging and laboratory studies on MyChart before your provider has had a chance to review them.  We understand that in some cases there may be results that are confusing or concerning to you. Not all laboratory results come back in the same time frame and the provider may be waiting for multiple results in order to interpret others.  Please give [place] 48 hours in order for your provider to thoroughly review all the results before contacting the office for clarification of your results.   We have sent the following medications to your pharmacy for you to pick up at your convenience: Protonix, Hydrocortisone, clenpiq Take milk of magnesia  15cc everyday.  You have been scheduled for an endoscopy and colonoscopy. Please follow the written instructions given to you at your visit today. Please pick up your prep supplies at the pharmacy within the next 1-3 days. If you use inhalers (even only as needed), please bring them with you on the day of your procedure.   It was a pleasure to see you today!  Jackquline Denmark, M.D.

## 2021-03-10 ENCOUNTER — Encounter: Payer: Self-pay | Admitting: Gastroenterology

## 2021-03-24 ENCOUNTER — Encounter: Payer: Self-pay | Admitting: Gastroenterology

## 2021-03-24 ENCOUNTER — Ambulatory Visit (AMBULATORY_SURGERY_CENTER): Payer: Self-pay | Admitting: Gastroenterology

## 2021-03-24 VITALS — BP 132/71 | HR 66 | Temp 96.6°F | Resp 13 | Ht 64.0 in | Wt 189.0 lb

## 2021-03-24 DIAGNOSIS — K640 First degree hemorrhoids: Secondary | ICD-10-CM

## 2021-03-24 DIAGNOSIS — B9681 Helicobacter pylori [H. pylori] as the cause of diseases classified elsewhere: Secondary | ICD-10-CM

## 2021-03-24 DIAGNOSIS — K625 Hemorrhage of anus and rectum: Secondary | ICD-10-CM

## 2021-03-24 DIAGNOSIS — K2951 Unspecified chronic gastritis with bleeding: Secondary | ICD-10-CM

## 2021-03-24 DIAGNOSIS — K921 Melena: Secondary | ICD-10-CM

## 2021-03-24 DIAGNOSIS — K581 Irritable bowel syndrome with constipation: Secondary | ICD-10-CM

## 2021-03-24 DIAGNOSIS — K2960 Other gastritis without bleeding: Secondary | ICD-10-CM

## 2021-03-24 DIAGNOSIS — K259 Gastric ulcer, unspecified as acute or chronic, without hemorrhage or perforation: Secondary | ICD-10-CM

## 2021-03-24 MED ORDER — SODIUM CHLORIDE 0.9 % IV SOLN: 500.0000 mL | Freq: Once | INTRAVENOUS | Status: DC

## 2021-03-24 MED ORDER — OMEPRAZOLE 40 MG PO CPDR: 40.0000 mg | DELAYED_RELEASE_CAPSULE | Freq: Every day | ORAL | 3 refills | Status: DC

## 2021-03-24 NOTE — Progress Notes (Signed)
? ? ?Chief Complaint:  ? ?Referring Provider:  [facility]    ? ? ?ASSESSMENT AND PLAN;  ? ?#1. Rectal bleeding ? ?#2. IBS-C, failed miralax ? ?#3. ?Melena. H/O N/V. Doubt biliary colic. Nl CBC, CMP, lipase ? ?Plan: ?-proronix 57m po QD #30, 6 RF ?-EGD/colon ?-MOM 15 cc po QD ?-HC 2.5% BID PR x 10 days. 4 RF ? ? ?I discussed EGD/Colonoscopy- the indications, risks, alternatives and potential complications including, but not limited to, bleeding, infection, reaction to medication, damage to internal organs, cardiac and/or pulmonary problems, and perforation requiring surgery. The possibility that significant findings could be missed was explained. All ? were answered. The patient gives consent to proceed. ? ?HPI:   ? ?ERexanne Inocenciois a 46y.o. female  ?With H/O breast cancer ? ?With ?  History of melena with intermittent N/V.  Associated with abdominal pain.  Had normal CBC.  Underwent CT Abdo/pelvis as below which did not show any acute abnormalities.  She also had normal CMP and lipase. ? ?Longstanding history of constipation despite MiraLAX daily and mineral oil as needed. ? ?Also has been complaining of occasional bright red blood per rectum and rectal pain when she has bowel movements. ? ?Denies having any urgency. ? ?No recent weight loss or loss of appetite. ? ?No nonsteroidals. ? ?I have reviewed her last CT with the patient as well. ? ? ?Previous GI work-up: ?CT AP with contrast 09/18/2020 ?IMPRESSION: ?Circumferential bladder wall thickening,  ?Cholelithiasis. 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 ?Past Medical History:  ?Diagnosis Date  ? Breast cancer ([place]   ? ? ?Past Surgical History:  ?Procedure Laterality Date  ? MASTECTOMY Left   ? ? ?Family History  ?Problem Relation Age of Onset  ? Colon cancer Neg Hx   ? Esophageal cancer Neg Hx   ? ? ?Social History  ? ?Tobacco Use  ? Smoking status:  Never  ? Smokeless tobacco: Never  ?Vaping Use  ? Vaping Use: Never used  ?Substance Use Topics  ? Alcohol use: Never  ? Drug use: Never  ? ? ?Current Outpatient Medications  ?Medication Sig Dispense Refill  ? Albuterol Sulfate (PROAIR HFA IN) Inhale into the lungs.    ? citalopram (CELEXA) 20 MG tablet Take 10 mg by mouth daily.    ? hydrocortisone (ANUSOL-HC) 2.5 % rectal cream Place 1 application rectally 2 (two) times daily. 30 g 4  ? levocetirizine (XYZAL) 5 MG tablet Take 5 mg by mouth every evening.    ? pantoprazole (PROTONIX) 40 MG tablet Take 1 tablet (40 mg total) by mouth daily. 30 tablet 6  ? Sod Picosulfate-Mag Ox-Cit Acd (CLENPIQ) 10-3.5-12 MG-GM -GM/160ML SOLN Take 1 kit by mouth as directed. 320 mL 0  ? ?Current Facility-Administered Medications  ?Medication Dose Route Frequency Provider Last Rate Last Admin  ? 0.9 %  sodium chloride infusion  500 mL Intravenous Once GJackquline Denmark MD      ? ? ?No Known Allergies ? ?Review of Systems:  ?Constitutional: Denies fever, chills, diaphoresis, appetite change and fatigue.  ?HEENT: Denies photophobia, eye pain, redness, hearing loss, ear pain, congestion, sore throat, rhinorrhea, sneezing, mouth sores, neck pain, neck stiffness and tinnitus.   ?Respiratory: Denies SOB, DOE, cough, chest tightness,  and wheezing.   ?Cardiovascular: Denies chest pain, palpitations and leg swelling.  ?Genitourinary: Denies dysuria, urgency, frequency, hematuria, flank pain and difficulty urinating.  ?  Musculoskeletal: Denies myalgias, back pain, joint swelling, arthralgias and gait problem.  ?Skin: No rash.  ?Neurological: Denies dizziness, seizures, syncope, weakness, light-headedness, numbness and headaches.  ?Hematological: Denies adenopathy. Easy bruising, personal or family bleeding history  ?Psychiatric/Behavioral: No anxiety or depression ? ?  ? ?Physical Exam:   ? ?BP 122/71   Pulse 68   Temp (!) 96.6 ?F (35.9 ?C)   Ht _0  (1.626 m)   Wt 189 lb (85.7 kg)   SpO2  100%   BMI 32.44 kg/m?  ?Wt Readings from Last 3 Encounters:  ?03/24/21 189 lb (85.7 kg)  ?02/16/21 189 lb 2 oz (85.8 kg)  ?01/11/21 179 lb 14.3 oz (81.6 kg)  ? ?Constitutional:  Well-developed, in no acute distress. ?Psychiatric: Normal mood and affect. Behavior is normal. ?HEENT: Pupils normal.  Conjunctivae are normal. No scleral icterus. ?Neck supple.  ?Cardiovascular: Normal rate, regular rhythm. No edema ?Pulmonary/chest: Effort normal and breath sounds normal. No wheezing, rales or rhonchi. ?Abdominal: Soft, nondistended. Nontender. Bowel sounds active throughout. There are no masses palpable. No hepatomegaly. ?Rectal: Deferred.  To be performed at the time of colonoscopy. ?Neurological: Alert and oriented to person place and time. ?Skin: Skin is warm and dry. No rashes noted. ? ?Data Reviewed: I have personally reviewed following labs and imaging studies ? ?CBC: ?CBC Latest Ref Rng & Units 01/11/2021 09/18/2020  ?WBC 4.0 - 10.5 K/uL 6.5 4.6  ?Hemoglobin 12.0 - 15.0 g/dL 12.9 12.4  ?Hematocrit 36.0 - 46.0 % 37.6 36.4  ?Platelets 150 - 400 K/uL 259 186  ? ? ?CMP: ?CMP Latest Ref Rng & Units 01/11/2021 09/18/2020  ?Glucose 70 - 99 mg/dL 104(H) 99  ?BUN 6 - 20 mg/dL 22(H) 12  ?Creatinine 0.44 - 1.00 mg/dL 0.74 0.54  ?Sodium 135 - 145 mmol/L 136 136  ?Potassium 3.5 - 5.1 mmol/L 3.5 4.0  ?Chloride 98 - 111 mmol/L 106 107  ?CO2 22 - 32 mmol/L 22 23  ?Calcium 8.9 - 10.3 mg/dL 8.8(L) 8.9  ?Total Protein 6.5 - 8.1 g/dL 7.3 6.8  ?Total Bilirubin 0.3 - 1.2 mg/dL 0.4 0.4  ?Alkaline Phos 38 - 126 U/L 53 45  ?AST 15 - 41 U/L 22 18  ?ALT 0 - 44 U/L 21 16  ? ? ? ? ? ? ?Carmell Austria, MD 03/24/2021, 10:14 AM ? ?Cc: [facility] ? ? ?

## 2021-03-24 NOTE — Op Note (Signed)
[place] ?Patient Name: Ann Andrade ?Procedure Date: 03/24/2021 10:28 AM ?MRN: [id number] ?Endoscopist: Jackquline Denmark , MD ?Age: 46 ?Referring MD:  ?Date of Birth: 1975/04/28 ?Gender: Female ?Account #: [id number] ?Procedure:                Upper GI endoscopy ?Indications:              Epigastric abdominal pain/H/O melena. ?Medicines:                Monitored Anesthesia Care ?Procedure:                Pre-Anesthesia Assessment: ?                          - Prior to the procedure, a History and Physical  ?                          was performed, and patient medications and  ?                          allergies were reviewed. The patient's tolerance of  ?                          previous anesthesia was also reviewed. The risks  ?                          and benefits of the procedure and the sedation  ?                          options and risks were discussed with the patient.  ?                          All questions were answered, and informed consent  ?                          was obtained. Prior Anticoagulants: The patient has  ?                          taken no previous anticoagulant or antiplatelet  ?                          agents. ASA Grade Assessment: II - A patient with  ?                          mild systemic disease. After reviewing the risks  ?                          and benefits, the patient was deemed in  ?                          satisfactory condition to undergo the procedure. ?                          After obtaining informed consent, the endoscope was  ?  passed under direct vision. Throughout the  ?                          procedure, the patient's blood pressure, pulse, and  ?                          oxygen saturations were monitored continuously. The  ?                          Endoscope was introduced through the mouth, and  ?                          advanced to the second part of duodenum. The upper  ?                          GI endoscopy was  accomplished without difficulty.  ?                          The patient tolerated the procedure well. ?Scope In: ?Scope Out: ?Findings:                 The examined esophagus was normal with well-defined  ?                          Z-line at 35 cm, examined by NBI. ?                          Localized moderate inflammation characterized by  ?                          erosions, erythema and friability was found in the  ?                          gastric antrum. Biopsies were taken with a cold  ?                          forceps for histology. ?                          The examined duodenum was normal. ?Complications:            No immediate complications. ?Estimated Blood Loss:     Estimated blood loss: none. ?Impression:               - Erosive gastritis. ?Recommendation:           - Patient has a contact number available for  ?                          emergencies. The signs and symptoms of potential  ?                          delayed complications were discussed with the  ?                          patient. Return to normal activities tomorrow.  ?  Written discharge instructions were provided to the  ?                          patient. ?                          - Resume previous diet. ?                          - Continue present medications. Must take Protonix  ?                          40 mg p.o. QD x 8 weeks, then can use it every  ?                          other day. ?                          - Await pathology results. ?                          - No aspirin, ibuprofen, naproxen, or other  ?                          non-steroidal anti-inflammatory drugs. ?                          - The findings and recommendations were discussed  ?                          with the patient's family. ?Jackquline Denmark, MD ?03/24/2021 10:58:50 AM ?This report has been signed electronically. ?

## 2021-03-24 NOTE — Op Note (Signed)
[place] ?Patient Name: Ann Andrade ?Procedure Date: 03/24/2021 10:25 AM ?MRN: [id number] ?Endoscopist: Jackquline Denmark , MD ?Age: 46 ?Referring MD:  ?Date of Birth: Feb 12, 1975 ?Gender: Female ?Account #: [id number] ?Procedure:                Colonoscopy ?Indications:              Rectal bleeding ?Medicines:                Monitored Anesthesia Care ?Procedure:                Pre-Anesthesia Assessment: ?                          - Prior to the procedure, a History and Physical  ?                          was performed, and patient medications and  ?                          allergies were reviewed. The patient's tolerance of  ?                          previous anesthesia was also reviewed. The risks  ?                          and benefits of the procedure and the sedation  ?                          options and risks were discussed with the patient.  ?                          All questions were answered, and informed consent  ?                          was obtained. Prior Anticoagulants: The patient has  ?                          taken no previous anticoagulant or antiplatelet  ?                          agents. ASA Grade Assessment: II - A patient with  ?                          mild systemic disease. After reviewing the risks  ?                          and benefits, the patient was deemed in  ?                          satisfactory condition to undergo the procedure. ?                          After obtaining informed consent, the colonoscope  ?  was passed under direct vision. Throughout the  ?                          procedure, the patient's blood pressure, pulse, and  ?                          oxygen saturations were monitored continuously. The  ?                          PCF-HQ190L Colonoscope was introduced through the  ?                          anus and advanced to the 2 cm into the ileum. The  ?                          colonoscopy was performed without difficulty. The   ?                          patient tolerated the procedure well. The quality  ?                          of the bowel preparation was fair. There was solid  ?                          retained stool in the transverse colon and in the  ?                          ascending colon limiting examination. The terminal  ?                          ileum, ileocecal valve, appendiceal orifice, and  ?                          rectum were photographed. ?Scope In: 10:44:47 AM ?Scope Out: 10:53:58 AM ?Scope Withdrawal Time: 0 hours 4 minutes 50 seconds  ?Total Procedure Duration: 0 hours 9 minutes 11 seconds  ?Findings:                 The colon (entire examined portion) appeared  ?                          grossly normal. Limited due to quality of  ?                          preparation. There were no circumferential lesions. ?                          Non-bleeding external and internal hemorrhoids were  ?                          found during retroflexion and during perianal exam.  ?                          The hemorrhoids were moderate and Grade I (internal  ?  hemorrhoids that do not prolapse). ?                          The terminal ileum appeared normal. ?                          The exam was otherwise without abnormality on  ?                          direct and retroflexion views. ?Complications:            No immediate complications. ?Estimated Blood Loss:     Estimated blood loss: none. ?Impression:               - Preparation of the colon was fair. No  ?                          circumferential lesions. ?                          - The entire examined colon is normal. ?                          - Non-bleeding external and internal hemorrhoids. ?                          - The examined portion of the ileum was normal. ?                          - The examination was otherwise normal on direct  ?                          and retroflexion views. ?                          - No specimens  collected. ?Recommendation:           - Patient has a contact number available for  ?                          emergencies. The signs and symptoms of potential  ?                          delayed complications were discussed with the  ?                          patient. Return to normal activities tomorrow.  ?                          Written discharge instructions were provided to the  ?                          patient. ?                          - Resume previous diet. ?                          -  Miralax 1 capful (17 grams) in 8 ounces of water  ?                          PO daily. ?                          - Hydrocortisone 2.5% cream 1 twice daily x 10  ?                          days, 2 refills. ?                          - Repeat colonoscopy for screening purposes at age  ?                          21 with 2-day prep. ?                          - The findings and recommendations were discussed  ?                          with the patient's family. ?Jackquline Denmark, MD ?03/24/2021 11:02:52 AM ?This report has been signed electronically. ?

## 2021-03-24 NOTE — Progress Notes (Signed)
Pt in recovery with monitors in place, VSS. Report given to receiving RN. Bite guard was placed with pt awake to ensure comfort. No dental or soft tissue damage noted. 

## 2021-03-24 NOTE — Progress Notes (Signed)
DT VS 

## 2021-03-24 NOTE — Patient Instructions (Signed)
YOU HAD AN ENDOSCOPIC PROCEDURE TODAY AT [facility]:   Refer to the procedure report that was given to you for any specific questions about what was found during the examination.  If the procedure report does not answer your questions, please call your gastroenterologist to clarify.  If you requested that your care partner not be given the details of your procedure findings, then the procedure report has been included in a sealed envelope for you to review at your convenience later. ? ?**Handouts given on Gastritis and hemorrhoids** ? ?YOU SHOULD EXPECT: Some feelings of bloating in the abdomen. Passage of more gas than usual.  Walking can help get rid of the air that was put into your GI tract during the procedure and reduce the bloating. If you had a lower endoscopy (such as a colonoscopy or flexible sigmoidoscopy) you may notice spotting of blood in your stool or on the toilet paper. If you underwent a bowel prep for your procedure, you may not have a normal bowel movement for a few days. ? ?Please Note:  You might notice some irritation and congestion in your nose or some drainage.  This is from the oxygen used during your procedure.  There is no need for concern and it should clear up in a day or so. ? ?SYMPTOMS TO REPORT IMMEDIATELY: ? ?Following lower endoscopy (colonoscopy or flexible sigmoidoscopy): ? Excessive amounts of blood in the stool ? Significant tenderness or worsening of abdominal pains ? Swelling of the abdomen that is new, acute ? Fever of 100?F or higher ? ?Following upper endoscopy (EGD) ? Vomiting of blood or coffee ground material ? New chest pain or pain under the shoulder blades ? Painful or persistently difficult swallowing ? New shortness of breath ? Fever of 100?F or higher ? Black, tarry-looking stools ? ?For urgent or emergent issues, a gastroenterologist can be reached at any hour by calling [phone]. ?Do not use MyChart messaging for urgent concerns.   ? ? ?DIET:  We do recommend a small meal at first, but then you may proceed to your regular diet.  Drink plenty of fluids but you should avoid alcoholic beverages for 24 hours. ? ?ACTIVITY:  You should plan to take it easy for the rest of today and you should NOT DRIVE or use heavy machinery until tomorrow (because of the sedation medicines used during the test).   ? ?FOLLOW UP: ?Our staff will call the number listed on your records 48-72 hours following your procedure to check on you and address any questions or concerns that you may have regarding the information given to you following your procedure. If we do not reach you, we will leave a message.  We will attempt to reach you two times.  During this call, we will ask if you have developed any symptoms of COVID 19. If you develop any symptoms (ie: fever, flu-like symptoms, shortness of breath, cough etc.) before then, please call [phone].  If you test positive for Covid 19 in the 2 weeks post procedure, please call and report this information to [place].   ? ?If any biopsies were taken you will be contacted by phone or by letter within the next 1-3 weeks.  Please call us at [phone] if you have not heard about the biopsies in 3 weeks.  ? ? ?SIGNATURES/CONFIDENTIALITY: ?You and/or your care partner have signed paperwork which will be entered into your electronic medical record.  These signatures attest to the fact that  that the information above on your After Visit Summary has been reviewed and is understood.  Full responsibility of the confidentiality of this discharge information lies with you and/or your care-partner.  ?

## 2021-03-26 ENCOUNTER — Telehealth: Payer: Self-pay | Admitting: *Deleted

## 2021-03-26 NOTE — Telephone Encounter (Signed)
?  Follow up Call- ? ?Call back number 03/24/2021  ?Post procedure Call Back phone  # [phone] cell  ?Permission to leave phone message Yes  ?  ? ?Patient questions: ? ?Do you have a fever, pain , or abdominal swelling? No. ?Pain Score  0 * ? ?Have you tolerated food without any problems? Yes.   ? ?Have you been able to return to your normal activities? Yes.   ? ?Do you have any questions about your discharge instructions: ?Diet   No. ?Medications  No. ?Follow up visit  No. ? ?Do you have questions or concerns about your Care? No. ? ?Actions: ?* If pain score is 4 or above: ?No action needed, pain <4. ? ? ?

## 2021-03-28 ENCOUNTER — Encounter: Payer: Self-pay | Admitting: Gastroenterology

## 2021-03-28 NOTE — Progress Notes (Signed)
Ann Andrade, ?can you let the pt know that gastric bx are + for H pylori. ?Would recommend the following treatment regimen for 14 days: ?Amoxicillin 1gm BID ?Clarithromycin '500mg'$  BID ?Flagyl '500mg'$  BID ?Protonix 40 BID  ? ?Can you please order this if no signfiicant interactions noted. Once done with therapy, the patient should wait one month and perform a stool study for H pylori antigen to ensure negative. The PPI needs to be held at least 2 weeks prior to submitting the stool sample. The patient should avoid alcohol while taking Flagyl.  ? ?Thanks ? ?Dr Lyndel Safe ? ?

## 2021-03-29 ENCOUNTER — Other Ambulatory Visit: Payer: Self-pay

## 2021-03-29 DIAGNOSIS — B9681 Helicobacter pylori [H. pylori] as the cause of diseases classified elsewhere: Secondary | ICD-10-CM

## 2021-03-29 MED ORDER — AMOXICILLIN 500 MG PO CAPS: 1000.0000 mg | ORAL_CAPSULE | Freq: Two times a day (BID) | ORAL | 0 refills | Status: AC

## 2021-03-29 MED ORDER — CLARITHROMYCIN 500 MG PO TABS: 500.0000 mg | ORAL_TABLET | Freq: Two times a day (BID) | ORAL | 0 refills | Status: AC

## 2021-03-29 MED ORDER — PANTOPRAZOLE SODIUM 40 MG PO TBEC: 40.0000 mg | DELAYED_RELEASE_TABLET | Freq: Two times a day (BID) | ORAL | 0 refills | Status: DC

## 2021-03-29 MED ORDER — METRONIDAZOLE 500 MG PO TABS: 500.0000 mg | ORAL_TABLET | Freq: Two times a day (BID) | ORAL | 0 refills | Status: AC

## 2021-03-30 NOTE — Addendum Note (Signed)
Addended by: Gillermina Hu on: 03/30/2021 10:03 AM ? ? Modules accepted: Orders ? ?

## 2021-04-10 ENCOUNTER — Other Ambulatory Visit: Payer: Self-pay | Admitting: Gastroenterology

## 2021-04-10 DIAGNOSIS — B9681 Helicobacter pylori [H. pylori] as the cause of diseases classified elsewhere: Secondary | ICD-10-CM

## 2021-04-15 ENCOUNTER — Telehealth: Payer: Self-pay

## 2021-04-15 NOTE — Telephone Encounter (Signed)
Pt arrived today with no appointment: Pt stated that she recently started the treatment for H Pylori and after three days of taking medication pt started having itching and discomfort in her vaginal area ( no discharge) Pt stated that she only completed 1 week of the treatment and then stopped due to the discomfort and itching ?Please advise on itching and further H Pylori Treatment:  ?

## 2021-04-20 NOTE — Telephone Encounter (Signed)
Pt made aware of Dr. Lyndel Safe recommendations:  ?Pt made aware to stop taking the Prilosec for 2 weeks before submitting the stool sample: ?Pt verbalized understanding with all questions answered.  ? ?

## 2021-04-20 NOTE — Telephone Encounter (Signed)
Thanks for letting me know. ?Lets get stool for H. pylori antigen. ?PPIs needs to be held for at least 2 weeks before ?RG ? ? ?

## 2021-05-11 ENCOUNTER — Other Ambulatory Visit: Payer: Medicaid Other

## 2021-05-11 DIAGNOSIS — B9681 Helicobacter pylori [H. pylori] as the cause of diseases classified elsewhere: Secondary | ICD-10-CM

## 2021-05-13 LAB — H. PYLORI ANTIGEN, STOOL: H pylori Ag, Stl: POSITIVE — AB

## 2021-06-07 ENCOUNTER — Other Ambulatory Visit: Payer: Self-pay

## 2021-06-07 DIAGNOSIS — B9681 Helicobacter pylori [H. pylori] as the cause of diseases classified elsewhere: Secondary | ICD-10-CM

## 2021-06-11 ENCOUNTER — Other Ambulatory Visit: Payer: Self-pay

## 2021-06-11 ENCOUNTER — Ambulatory Visit (INDEPENDENT_AMBULATORY_CARE_PROVIDER_SITE_OTHER): Payer: Self-pay | Admitting: Internal Medicine

## 2021-06-11 ENCOUNTER — Encounter: Payer: Self-pay | Admitting: Internal Medicine

## 2021-06-11 DIAGNOSIS — K2970 Gastritis, unspecified, without bleeding: Secondary | ICD-10-CM

## 2021-06-11 DIAGNOSIS — A048 Other specified bacterial intestinal infections: Secondary | ICD-10-CM | POA: Insufficient documentation

## 2021-06-11 DIAGNOSIS — B9681 Helicobacter pylori [H. pylori] as the cause of diseases classified elsewhere: Secondary | ICD-10-CM

## 2021-06-11 MED ORDER — METRONIDAZOLE 500 MG PO TABS: 500.0000 mg | ORAL_TABLET | Freq: Four times a day (QID) | ORAL | 0 refills | Status: DC

## 2021-06-11 MED ORDER — TETRACYCLINE HCL 500 MG PO CAPS: 500.0000 mg | ORAL_CAPSULE | Freq: Four times a day (QID) | ORAL | 0 refills | Status: DC

## 2021-06-11 MED ORDER — BISMUTH SUBSALICYLATE 262 MG PO CHEW: 524.0000 mg | CHEWABLE_TABLET | ORAL | 0 refills | Status: DC | PRN

## 2021-06-11 MED ORDER — PANTOPRAZOLE SODIUM 40 MG PO TBEC: 40.0000 mg | DELAYED_RELEASE_TABLET | Freq: Two times a day (BID) | ORAL | 0 refills | Status: DC

## 2021-06-11 NOTE — Progress Notes (Signed)
[facility]      Reason for Consult: refractory H pylori infection    Referring Physician: Dr. Lyndel Safe    Patient ID: Ann Andrade, female    DOB: 1976-01-08, 46 y.o.   MRN: [id number]  HPI:   She is followed by Dr. Lyndel Safe of GI for rectal bleeding, melena, IBS-C and underwent upper and low endoscopy 03/24/21 and gastric biopsy was positive for H pylori.  She was started on Clarithromycin, amoxicillin, metronidazole and protonix for 2 weeks though she states she get an itch on amoxicillin and did not take it.  Her main symptoms are bloating, early satiety, nausea with eating and reflux symptoms that have been ongoing for about 2-3 months.  No fever, no weight loss.   Past Medical History:  Diagnosis Date   Anemia    Anxiety    Asthma    Breast cancer ([place])     Prior to Admission medications   Medication Sig Start Date End Date Taking? Authorizing Provider  Albuterol Sulfate (PROAIR HFA IN) Inhale into the lungs.    [provider]  citalopram (CELEXA) 20 MG tablet Take 10 mg by mouth daily.    [provider]  hydrocortisone (ANUSOL-HC) 2.5 % rectal cream Place 1 application rectally 2 (two) times daily. 02/16/21   Jackquline Denmark, MD  levocetirizine (XYZAL) 5 MG tablet Take 5 mg by mouth every evening.    [provider]  omeprazole (PRILOSEC) 40 MG capsule Take 1 capsule (40 mg total) by mouth daily. Take everyday for 8 weeks, then can take every other day 03/24/21   Jackquline Denmark, MD  pantoprazole (PROTONIX) 40 MG tablet Take 1 tablet (40 mg total) by mouth 2 (two) times daily for 14 days. 03/29/21 04/12/21  Jackquline Denmark, MD    No Known Allergies  Social History   Tobacco Use   Smoking status: Never   Smokeless tobacco: Never  Vaping Use   Vaping Use: Never used  Substance Use Topics   Alcohol use: Never   Drug use: Never    Family History  Problem Relation Age of Onset   Colon cancer Neg Hx    Esophageal cancer Neg Hx     Rectal cancer Neg Hx    Stomach cancer Neg Hx     Review of Systems  Constitutional: negative for fatigue, malaise, and weight loss Integument/breast: negative for rash All other systems reviewed and are negative    Constitutional: in no apparent distress There were no vitals filed for this visit. EYES: anicteric Respiratory: normal respiratory effort Musculoskeletal: no edema GI: + bloating, nt  Labs: Lab Results  Component Value Date   WBC 6.5 01/11/2021   HGB 12.9 01/11/2021   HCT 37.6 01/11/2021   MCV 88.1 01/11/2021   PLT 259 01/11/2021    Lab Results  Component Value Date   CREATININE 0.74 01/11/2021   BUN 22 (H) 01/11/2021   NA 136 01/11/2021   K 3.5 01/11/2021   CL 106 01/11/2021   CO2 22 01/11/2021    Lab Results  Component Value Date   ALT 21 01/11/2021   AST 22 01/11/2021   ALKPHOS 53 01/11/2021   BILITOT 0.4 01/11/2021   INR 1.0 01/11/2021     Assessment/plan: refractory H pylori infection, partially due to inadequate treatment.  I discussed treatment options and will try tetracycline, Bismuth, ppi and metronidazole for 2 weeks.  She will then return in 4 weeks for symptom check and repeat  an H pylori stool Ag.

## 2021-06-17 ENCOUNTER — Ambulatory Visit: Payer: Medicaid Other | Admitting: Nurse Practitioner

## 2021-06-22 ENCOUNTER — Other Ambulatory Visit: Payer: Self-pay | Admitting: Internal Medicine

## 2021-06-22 DIAGNOSIS — B9681 Helicobacter pylori [H. pylori] as the cause of diseases classified elsewhere: Secondary | ICD-10-CM

## 2021-07-09 ENCOUNTER — Ambulatory Visit (INDEPENDENT_AMBULATORY_CARE_PROVIDER_SITE_OTHER): Payer: Commercial Managed Care - HMO | Admitting: Internal Medicine

## 2021-07-09 ENCOUNTER — Other Ambulatory Visit (HOSPITAL_BASED_OUTPATIENT_CLINIC_OR_DEPARTMENT_OTHER): Payer: Self-pay

## 2021-07-09 ENCOUNTER — Other Ambulatory Visit: Payer: Self-pay

## 2021-07-09 ENCOUNTER — Encounter: Payer: Self-pay | Admitting: Internal Medicine

## 2021-07-09 DIAGNOSIS — R110 Nausea: Secondary | ICD-10-CM

## 2021-07-09 DIAGNOSIS — A048 Other specified bacterial intestinal infections: Secondary | ICD-10-CM

## 2021-07-09 DIAGNOSIS — K2970 Gastritis, unspecified, without bleeding: Secondary | ICD-10-CM | POA: Diagnosis not present

## 2021-07-09 DIAGNOSIS — B9681 Helicobacter pylori [H. pylori] as the cause of diseases classified elsewhere: Secondary | ICD-10-CM | POA: Diagnosis not present

## 2021-07-09 DIAGNOSIS — R634 Abnormal weight loss: Secondary | ICD-10-CM

## 2021-07-09 MED ORDER — PANTOPRAZOLE SODIUM 40 MG PO TBEC
40.0000 mg | DELAYED_RELEASE_TABLET | Freq: Every day | ORAL | 1 refills | Status: DC
  Filled 2021-07-09: qty 30, 30d supply, fill #0

## 2021-07-12 ENCOUNTER — Other Ambulatory Visit: Payer: Self-pay

## 2021-07-12 ENCOUNTER — Other Ambulatory Visit: Payer: Commercial Managed Care - HMO

## 2021-07-12 DIAGNOSIS — B9681 Helicobacter pylori [H. pylori] as the cause of diseases classified elsewhere: Secondary | ICD-10-CM

## 2021-07-13 LAB — HELICOBACTER PYLORI  SPECIAL ANTIGEN
MICRO NUMBER:: 13571938
RESULT:: DETECTED — AB
SPECIMEN QUALITY: ADEQUATE

## 2021-07-14 ENCOUNTER — Telehealth: Payer: Self-pay

## 2021-07-14 NOTE — Telephone Encounter (Signed)
Spoke with patient, notified her that H.pylori test was positive and scheduled her to see pharmacy Friday afternoon. Patient verbalized understanding and has no further questions.   Beryle Flock, RN

## 2021-07-14 NOTE — Telephone Encounter (Signed)
-----   Message from Thayer Headings, MD sent at 07/14/2021 11:41 AM EDT ----- Triage - can you let her know that her stool test for H pylori is again positive. Can you see about getting her scheduled with a provider or pharmacy to come up with a new H pylori regimen she can try?   Cassie/Amanda - not sure what would be next for her.  Did not tolerate amoxicillin.   Thanks! ----- Message ----- From: Cheyenne Adas Lab Results In Sent: 07/13/2021   4:12 PM EDT To: Thayer Headings, MD

## 2021-07-16 ENCOUNTER — Other Ambulatory Visit (HOSPITAL_BASED_OUTPATIENT_CLINIC_OR_DEPARTMENT_OTHER): Payer: Self-pay

## 2021-07-16 ENCOUNTER — Ambulatory Visit (INDEPENDENT_AMBULATORY_CARE_PROVIDER_SITE_OTHER): Payer: Commercial Managed Care - HMO | Admitting: Pharmacist

## 2021-07-16 ENCOUNTER — Other Ambulatory Visit (HOSPITAL_COMMUNITY): Payer: Self-pay

## 2021-07-16 ENCOUNTER — Other Ambulatory Visit: Payer: Self-pay

## 2021-07-16 DIAGNOSIS — K2970 Gastritis, unspecified, without bleeding: Secondary | ICD-10-CM

## 2021-07-16 DIAGNOSIS — B9681 Helicobacter pylori [H. pylori] as the cause of diseases classified elsewhere: Secondary | ICD-10-CM | POA: Diagnosis not present

## 2021-07-16 MED ORDER — DOXYCYCLINE HYCLATE 100 MG PO TABS: 100.0000 mg | ORAL_TABLET | Freq: Two times a day (BID) | ORAL | 0 refills | Status: DC

## 2021-07-16 MED ORDER — LEVOFLOXACIN 500 MG PO TABS: 500.0000 mg | ORAL_TABLET | Freq: Every day | ORAL | 0 refills | Status: DC

## 2021-07-16 NOTE — Progress Notes (Addendum)
HPI: Ann Andrade is a 46 y.o. female who presents to the [facility] pharmacy clinic for H. pylori follow-up.  Patient Active Problem List   Diagnosis Date Noted   Nausea 07/09/2021   Weight loss 07/09/2021   H. pylori infection 06/11/2021    Patient's Medications  New Prescriptions   DOXYCYCLINE (VIBRA-TABS) 100 MG TABLET    Take 1 tablet (100 mg total) by mouth 2 (two) times daily.   LEVOFLOXACIN (LEVAQUIN) 500 MG TABLET    Take 1 tablet (500 mg total) by mouth daily.  Previous Medications   ALBUTEROL SULFATE (PROAIR HFA IN)    Inhale into the lungs.   CITALOPRAM (CELEXA) 20 MG TABLET    Take 10 mg by mouth daily.   LEVOCETIRIZINE (XYZAL) 5 MG TABLET    Take 5 mg by mouth every evening.   PANTOPRAZOLE (PROTONIX) 40 MG TABLET    Take 1 tablet (40 mg total) by mouth daily.  Modified Medications   No medications on file  Discontinued Medications   No medications on file    Allergies: No Known Allergies  Past Medical History: Past Medical History:  Diagnosis Date   Anemia    Anxiety    Asthma    Breast cancer ([facility])    Cholelithiasis    H. pylori infection    Internal hemorrhoids     Social History: Social History   Socioeconomic History   Marital status: Married    Spouse name: Not on file   Number of children: Not on file   Years of education: Not on file   Highest education level: Not on file  Occupational History   Not on file  Tobacco Use   Smoking status: Never   Smokeless tobacco: Never  Vaping Use   Vaping Use: Never used  Substance and Sexual Activity   Alcohol use: Never   Drug use: Never   Sexual activity: Not on file    Comment: tubeal ligation  Other Topics Concern   Not on file  Social History Narrative   Not on file   Social Determinants of Health   Financial Resource Strain: Not on file  Food Insecurity: Not on file  Transportation Needs: Not on file  Physical Activity: Not on file  Stress: Not on file  Social Connections: Not on file     Labs: No results found for: "HIV1RNAQUANT", "HIV1RNAVL", "CD4TABS"  RPR and STI No results found for: "LABRPR", "RPRTITER"      No data to display          Hepatitis B No results found for: "HEPBSAB", "HEPBSAG", "HEPBCAB" Hepatitis C No results found for: "HEPCAB", "HCVRNAPCRQN" Hepatitis A No results found for: "HAV" Lipids: No results found for: "CHOL", "TRIG", "HDL", "CHOLHDL", "VLDL", "LDLCALC"  Prior H. pylori Regimens: Initial - clarithromycin + amoxicillin + metronidazole + pantoprazole x 14 days  Second - pantoprazole + bismuth + tetracycline + metronidazole x 14 days   Assessment: [name] presents to clinic today for H. pylori follow-up. She has failed two courses of antibiotics for H. pylori and remains symptomatic along with stool test remaining positive this week. She struggles with adherence due to the antibiotic side effects. She is unwilling to trial amoxicillin again stating it caused her vagina to be dry and that she developed a yeast infection while taking the amoxicillin. Emphasized the importance of adherence for 14 days to help eradicate the bacteria and relieve her of these symptoms. She complains of ongoing bloating, abdominal discomfort, and nausea.  She agreed to trial levofloxacin, doxycycline, and pantoprazole x 14 days. The most appropriate salvage regimen would include amoxicillin as it remains one of the mainstays of H. Pylori treatment and rarely develops resistance to the bacteria as compared to clarithromycin and fluoroquinolones. As she is unwilling to take, will replace it with doxycycline. She is already taking pantoprazole once daily per Dr. Luciana Axe. Reviewed the importance of taking doxycycline with food and sitting up for one hour after taking. Encouraged her to avoid dairy around the time of administration and to wear sunscreen when she is outside to protect her skin. She will follow-up with GI in July for further assessment.   Plan: Start  levofloxacin 500mg  once daily, doxycycline 100mg  twice daily, and pantoprazole 40mg  once daily x 14 days Prescriptions sent to [facility] in [facility] Follow-up with GI on 08/03/21  Margarite Gouge, PharmD, CPP Clinical Pharmacist Practitioner Infectious Diseases Clinical Pharmacist [facility] 07/16/2021, 3:33 PM

## 2021-08-03 ENCOUNTER — Ambulatory Visit: Payer: Medicaid Other | Admitting: Nurse Practitioner

## 2021-08-18 ENCOUNTER — Other Ambulatory Visit: Payer: Self-pay

## 2021-08-18 ENCOUNTER — Ambulatory Visit: Payer: Commercial Managed Care - HMO | Admitting: Internal Medicine

## 2021-08-18 ENCOUNTER — Encounter: Payer: Self-pay | Admitting: Internal Medicine

## 2021-08-18 VITALS — BP 120/78 | HR 67 | Temp 98.1°F | Ht 64.0 in | Wt 196.0 lb

## 2021-08-18 DIAGNOSIS — R634 Abnormal weight loss: Secondary | ICD-10-CM

## 2021-08-18 DIAGNOSIS — A048 Other specified bacterial intestinal infections: Secondary | ICD-10-CM | POA: Diagnosis not present

## 2021-08-18 DIAGNOSIS — R110 Nausea: Secondary | ICD-10-CM

## 2021-08-18 NOTE — Addendum Note (Signed)
Addended by: Thayer Headings on: 08/18/2021 01:30 PM   Modules accepted: Level of Service

## 2021-08-18 NOTE — Assessment & Plan Note (Signed)
Seems improved overall.  At this point, I will retest her in a week or two.  If negative, no return needed.

## 2021-08-18 NOTE — Progress Notes (Signed)
   Subjective:    Patient ID: Ann Andrade, female    DOB: Oct 28, 1975, 46 y.o.   MRN: [id number]  HPI Here for reevaluation of H pylori infection.  She completed 3 different regimens for H pylori and here for follow up.  She had intermittent compliance but completed her last regimen of levofloxacin, doxycycline and pantoprazole for 14 days and states she does feel better.  She has started a medicine that sounds like Ozempic for weight loss and has received it once to date.  She has noted some nausea since starting.  No complaints otherwise.  She missed her GI appointment after going to the wrong office location.    Review of Systems  Constitutional:  Negative for fatigue.  Gastrointestinal:  Positive for nausea. Negative for diarrhea.       Objective:   Physical Exam Eyes:     General: No scleral icterus. Pulmonary:     Effort: Pulmonary effort is normal.  Neurological:     Mental Status: She is alert.   SH: no tobacco        Assessment & Plan:

## 2021-08-18 NOTE — Assessment & Plan Note (Signed)
This may be from her new medication.  No weight loss or concerning signs.

## 2021-08-18 NOTE — Assessment & Plan Note (Addendum)
She has not had any further issues with losing weight with the treatments.  This has stabilized and now she is trying to purposefully lose weight.

## 2021-10-20 ENCOUNTER — Ambulatory Visit: Payer: Commercial Managed Care - HMO | Admitting: Nurse Practitioner

## 2021-10-22 NOTE — Progress Notes (Signed)
NEW PATIENT Date of Service/Encounter:  10/25/21 Referring provider: [facility] Primary care provider: [facility]  Subjective:  Ann Andrade is a 46 y.o. female with a PMHx of H. pylori infection presenting today for evaluation of chronic rhinitis. History obtained from: chart review and patient.   Chronic rhinitis:  She does feel she has allergies.  Started in childhood. Never allergy tested. Current symptoms: She gets ear pain, sore throat, bad breath, headaches-worse on top of her head. She was given an injection of prednisone to see if this helps her symptoms.  She was also given oral steroids, cough syrup and this didn't help much. Prednisone injection did help some. She is unable to hear well out of her right ear, left ear without symptom. These are new symptoms, started about one month ago.  Headaches are her worst symptoms.  She also has reflux, not taking any medication for this.  She has tried OTC medications.  She has tried OTC antihistamines, currently taking Xyzal.  She has had medications in the past 3 days and is unable to do allergy testing today. She has not tried nasal sprays, but has a fear of using these.  She has been evaluated in ED for migraines due to intensity of headaches.  Treated with IV fluids.  Asthma:  - diagnosed  with asthma 9 years ago after her second child -  rescue inhaler once every 1-2 months, not using albuterol prior to exercise - never hospitalized for her asthma - never used a controller inhaler - triggers: only when she is sick, occasionally during exercise  Per chart review, she is followed by ID for history of H. pylori.  Last visit 08/18/2021.  Has received treatment.  Other allergy screening: Food allergy: no Medication allergy: no Hymenoptera allergy: no Urticaria: no Eczema:no History of recurrent infections suggestive of immunodeficency: no Vaccinations are up to date.   Past  Medical History: Past Medical History:  Diagnosis Date   Anemia    Anxiety    Asthma    Breast cancer ([place])    Cholelithiasis    H. pylori infection    Internal hemorrhoids    Medication List:  Current Outpatient Medications  Medication Sig Dispense Refill   albuterol (VENTOLIN HFA) 108 (90 Base) MCG/ACT inhaler Inhale 2 puffs into the lungs every 6 (six) hours as needed for wheezing or shortness of breath. 18 g 2   fluticasone (FLONASE) 50 MCG/ACT nasal spray Place 2 sprays into both nostrils daily. 16 g 2   levocetirizine (XYZAL) 5 MG tablet Take 1 tablet (5 mg total) by mouth every evening. 32 tablet 5   montelukast (SINGULAIR) 10 MG tablet Take 1 tablet (10 mg total) by mouth at bedtime. 32 tablet 5   No current facility-administered medications for this visit.   Known Allergies:  No Known Allergies Past Surgical History: Past Surgical History:  Procedure Laterality Date   c setion     x2   MASTECTOMY Left    TUBAL LIGATION     Family History: Family History  Problem Relation Age of Onset   Colon cancer Neg Hx    Esophageal cancer Neg Hx    Rectal cancer Neg Hx    Stomach cancer Neg Hx    Social History: Averey lives in a house without water damage, carpet in the bedroom, electric heating and central AC, pet dog indoors, no cockroaches, no personal smoking history.  She works as a Scientist, water quality.  No HEPA filter in the home.  [place]   ROS:  All other systems negative except as noted per HPI.  Objective:  Blood pressure 110/62, pulse 86, temperature 98.1 F (36.7 C), temperature source Temporal, resp. rate 18, height 5' 2.25" (1.581 m), weight 194 lb 1.6 oz (88 kg), SpO2 98 %. Body mass index is 35.22 kg/m. Physical Exam:  General Appearance:  Alert, cooperative, no distress, appears stated age  Head:  Normocephalic, without obvious abnormality, atraumatic  Eyes:  Conjunctiva clear, EOM's intact  Ears Normal bilateral EACS and TMs   Nose: Nares normal, hypertrophic turbinates, normal mucosa, no visible anterior polyps, and septum midline  Throat: Lips, tongue normal; teeth and gums normal, normal posterior oropharynx, tonsils 3+, and no tonsillar exudate  Neck: Supple, symmetrical  Lungs:   clear to auscultation bilaterally, Respirations unlabored, no coughing  Heart:  regular rate and rhythm and no murmur, Appears well perfused  Extremities: No edema  Skin: Skin color, texture, turgor normal, no rashes or lesions on visualized portions of skin  Neurologic: No gross deficits     Diagnostics: Spirometry:  Tracings reviewed. Her effort: Good reproducible efforts. FVC: 2.75L  FEV1: 1.88L, 73% predicted FEV1/FVC ratio: 0.68 Interpretation: Spirometry consistent with moderate obstructive disease   Skin Testing: Deferred due to recent antihistamines use.  Assessment and Plan  Intermittent Asthma: Diagnosed following second pregnancy. Breathing test showed moderate obstruction, but she reports control.  Will continue current plan for now, reassess at follow-up. - Rescue Inhaler: Albuterol (Proair/Ventolin) 2 puffs . Use  every 4-6 hours as needed for chest tightness, wheezing, or coughing.  Can also use 15 minutes prior to exercise if you have symptoms with activity. - Asthma is not controlled if:  - Symptoms are occurring >2 times a week OR  - >2 times a month nighttime awakenings  - You are requiring systemic steroids (prednisone/steroid injections) more than once per year  - Your require hospitalization for your asthma.  - Please call the clinic to schedule a follow up if these symptoms arise  Chronic Rhinitis -possibly allergic: Ongoing since childhood, but recently worse in past month, characterized by headaches and right ear pain.  Never previously allergy tested. Not using nasal sprays due to fear of introduction of nasal medication. Recently treated with prednisone with some relief. - allergy testing today  was deferred due to recent antihistamine use - start nasal saline spray-2 sprays each nostril twice daily followed by medicated nasal sprays - Start Nasal Steroid Spray: Options include Flonase (fluticasone), Nasocort (triamcinolone), Nasonex (mometasome) 1- 2 sprays in each nostril daily (can buy over-the-counter if not covered by insurance)  Best results if used daily. - Start Singulair (Montelukast) '10mg'$  nightly. - Start over the counter antihistamine daily or daily as needed.   -Your options include Zyrtec (Cetirizine) '10mg'$ , Claritin (Loratadine) '10mg'$ , Allegra (Fexofenadine) '180mg'$ , or Xyzal (Levocetirinze) '5mg'$   Allergic Conjunctivitis:  - Consider Allergy Eye drops: great options include Pataday (Olopatadine) or Zaditor (ketotifen) for eye symptoms daily as needed-both sold over the counter if not covered by insurance.   -Avoid eye drops that say red eye relief as they may contain medications that dry out your eyes.  Reflux-not controlled on unknown OTC medications - will determine which medications she is currently taking for reflux at follow-up to help with future recommendations - she does have a history of H. Pylori, treated by ID -Keep GI appointment scheduled for 11/04/2021  Follow-up in 4 weeks for allergy testing  and assessment of current plan. Must stop antihistamines for 3 days prior to this visit to be able to perform testing.  This note in its entirety was forwarded to the Provider who requested this consultation.  Thank you for your kind referral. I appreciate the opportunity to take part in Coy's care. Please do not hesitate to contact me with questions.  Sincerely,  Sigurd Sos, MD [facility] [place] [facility]

## 2021-10-25 ENCOUNTER — Other Ambulatory Visit (HOSPITAL_BASED_OUTPATIENT_CLINIC_OR_DEPARTMENT_OTHER): Payer: Self-pay

## 2021-10-25 ENCOUNTER — Ambulatory Visit: Payer: Commercial Managed Care - HMO | Admitting: Internal Medicine

## 2021-10-25 ENCOUNTER — Encounter: Payer: Self-pay | Admitting: Internal Medicine

## 2021-10-25 VITALS — BP 110/62 | HR 86 | Temp 98.1°F | Resp 18 | Ht 62.25 in | Wt 194.1 lb

## 2021-10-25 DIAGNOSIS — J4520 Mild intermittent asthma, uncomplicated: Secondary | ICD-10-CM | POA: Diagnosis not present

## 2021-10-25 DIAGNOSIS — H1013 Acute atopic conjunctivitis, bilateral: Secondary | ICD-10-CM

## 2021-10-25 DIAGNOSIS — K219 Gastro-esophageal reflux disease without esophagitis: Secondary | ICD-10-CM | POA: Diagnosis not present

## 2021-10-25 DIAGNOSIS — J310 Chronic rhinitis: Secondary | ICD-10-CM

## 2021-10-25 MED ORDER — ALBUTEROL SULFATE HFA 108 (90 BASE) MCG/ACT IN AERS
2.0000 | INHALATION_SPRAY | Freq: Four times a day (QID) | RESPIRATORY_TRACT | 2 refills | Status: AC | PRN
  Filled 2021-10-25: qty 6.7, 25d supply, fill #0

## 2021-10-25 MED ORDER — LEVOCETIRIZINE DIHYDROCHLORIDE 5 MG PO TABS
5.0000 mg | ORAL_TABLET | Freq: Every evening | ORAL | 5 refills | Status: AC
  Filled 2021-10-25: qty 32, 32d supply, fill #0
  Filled 2022-02-16: qty 32, 32d supply, fill #1

## 2021-10-25 MED ORDER — FLUTICASONE PROPIONATE 50 MCG/ACT NA SUSP
2.0000 | Freq: Every day | NASAL | 2 refills | Status: DC
  Filled 2021-10-25: qty 16, 30d supply, fill #0

## 2021-10-25 MED ORDER — MONTELUKAST SODIUM 10 MG PO TABS
10.0000 mg | ORAL_TABLET | Freq: Every day | ORAL | 5 refills | Status: AC
  Filled 2021-10-25: qty 32, 32d supply, fill #0

## 2021-10-25 NOTE — Patient Instructions (Addendum)
Intermittent Asthma: - Rescue Inhaler: Albuterol (Proair/Ventolin) 2 puffs . Use  every 4-6 hours as needed for chest tightness, wheezing, or coughing.  Can also use 15 minutes prior to exercise if you have symptoms with activity. - Asthma is not controlled if:  - Symptoms are occurring >2 times a week OR  - >2 times a month nighttime awakenings  - You are requiring systemic steroids (prednisone/steroid injections) more than once per year  - Your require hospitalization for your asthma.  - Please call the clinic to schedule a follow up if these symptoms arise  Chronic Rhinitis -possibly allergic: - allergy testing today was deferred due to recent antihistamine use - start nasal saline spray-2 sprays each nostril twice daily followed by medicated nasal sprays - Start Nasal Steroid Spray: Options include Flonase (fluticasone), Nasocort (triamcinolone), Nasonex (mometasome) 1- 2 sprays in each nostril daily (can buy over-the-counter if not covered by insurance)  Best results if used daily. - Start Singulair (Montelukast) '10mg'$  nightly. - Start over the counter antihistamine daily or daily as needed.   -Your options include Zyrtec (Cetirizine) '10mg'$ , Claritin (Loratadine) '10mg'$ , Allegra (Fexofenadine) '180mg'$ , or Xyzal (Levocetirinze) '5mg'$   Allergic Conjunctivitis:  - Consider Allergy Eye drops: great options include Pataday (Olopatadine) or Zaditor (ketotifen) for eye symptoms daily as needed-both sold over the counter if not covered by insurance.   -Avoid eye drops that say red eye relief as they may contain medications that dry out your eyes.  Reflux-not controlled on unknown OTC medications - will determine which medications she is currently taking for reflux at follow-up to help with future recommendations - she does have a history of H. Pylori, treated by ID -Keep GI appointment scheduled for 11/04/2021  Follow-up in 4 weeks for allergy testing. Must stop antihistamines for 3 days prior to this  visit to be able to perform testing.  It was wonderful meeting you today! Thank you for letting me participate in your care. Sigurd Sos, MD [facility]

## 2021-10-26 ENCOUNTER — Other Ambulatory Visit (HOSPITAL_BASED_OUTPATIENT_CLINIC_OR_DEPARTMENT_OTHER): Payer: Self-pay

## 2021-10-31 ENCOUNTER — Other Ambulatory Visit: Payer: Self-pay

## 2021-10-31 ENCOUNTER — Encounter (HOSPITAL_BASED_OUTPATIENT_CLINIC_OR_DEPARTMENT_OTHER): Payer: Self-pay | Admitting: Emergency Medicine

## 2021-10-31 ENCOUNTER — Emergency Department (HOSPITAL_BASED_OUTPATIENT_CLINIC_OR_DEPARTMENT_OTHER)
Admission: EM | Admit: 2021-10-31 | Discharge: 2021-10-31 | Disposition: A | Discharge status: Home / Self Care | Payer: Commercial Managed Care - HMO | Attending: Emergency Medicine | Admitting: Emergency Medicine

## 2021-10-31 DIAGNOSIS — G43809 Other migraine, not intractable, without status migrainosus: Secondary | ICD-10-CM | POA: Diagnosis not present

## 2021-10-31 DIAGNOSIS — J45909 Unspecified asthma, uncomplicated: Secondary | ICD-10-CM | POA: Diagnosis not present

## 2021-10-31 DIAGNOSIS — R519 Headache, unspecified: Secondary | ICD-10-CM | POA: Diagnosis present

## 2021-10-31 MED ORDER — KETOROLAC TROMETHAMINE 30 MG/ML IJ SOLN
30.0000 mg | Freq: Once | INTRAMUSCULAR | Status: AC
  Administered 2021-10-31: 30 mg via INTRAVENOUS
  Filled 2021-10-31: qty 1

## 2021-10-31 MED ORDER — SODIUM CHLORIDE 0.9 % IV BOLUS
500.0000 mL | Freq: Once | INTRAVENOUS | Status: AC
  Administered 2021-10-31: 500 mL via INTRAVENOUS

## 2021-10-31 MED ORDER — PROCHLORPERAZINE MALEATE 10 MG PO TABS: 10.0000 mg | ORAL_TABLET | Freq: Four times a day (QID) | ORAL | 0 refills | Status: AC | PRN

## 2021-10-31 MED ORDER — MAGNESIUM SULFATE IN D5W 1-5 GM/100ML-% IV SOLN
1.0000 g | Freq: Once | INTRAVENOUS | Status: AC
  Administered 2021-10-31: 1 g via INTRAVENOUS
  Filled 2021-10-31: qty 100

## 2021-10-31 MED ORDER — PROCHLORPERAZINE EDISYLATE 10 MG/2ML IJ SOLN
10.0000 mg | Freq: Once | INTRAMUSCULAR | Status: AC
  Administered 2021-10-31: 10 mg via INTRAVENOUS
  Filled 2021-10-31: qty 2

## 2021-10-31 NOTE — ED Provider Notes (Signed)
Emergency Department Provider Note   I have reviewed the triage vital signs and the nursing notes.   HISTORY  Chief Complaint Headache  Spanish interpreter used for this encounter  HPI Ann Andrade is a 46 y.o. female past history reviewed below presents emergency department with headache.  Patient has had headache with associated nausea and mild photophobia over the past 2 days.  She notes a prior history of migraines and states this headache feels similar to that.  She tried taking ibuprofen with no relief.  No fevers or chills.  No head injury.  Denies any sudden onset/maximal intensity headache but rather a slowly building, throbbing HA. She got to the point this evening she was unable to tolerate pain well at home and so presents for further evaluation.     Past Medical History:  Diagnosis Date   Anemia    Anxiety    Asthma    Breast cancer ([place])    Cholelithiasis    H. pylori infection    Internal hemorrhoids     Review of Systems  Constitutional: No fever/chills Eyes: Mild photophobia. Cardiovascular: Denies chest pain. Respiratory: Denies shortness of breath. Gastrointestinal: No abdominal pain.  Positive nausea, no vomiting.  No diarrhea.   Musculoskeletal: Negative for back pain. Skin: Negative for rash. Neurological: Positive HA. No weakness/numbness.   ____________________________________________   PHYSICAL EXAM:  VITAL SIGNS: Vitals:   10/31/21 0430 10/31/21 0545  BP: 102/74 101/70  Pulse: 76 88  Resp:  18  Temp:  98.4 F (36.9 C)  SpO2: 98% 100%    Constitutional: Alert and oriented. Well appearing and in no acute distress. Eyes: Conjunctivae are normal. PERRL. EOMI. Head: Atraumatic. Nose: No congestion/rhinnorhea. Mouth/Throat: Mucous membranes are moist.  Oropharynx non-erythematous. Neck: No stridor.  No meningeal signs. Cardiovascular: Normal rate, regular rhythm. Good peripheral circulation. Grossly normal heart sounds.    Respiratory: Normal respiratory effort.  No retractions. Lungs CTAB. Gastrointestinal: Soft and nontender. No distention.  Musculoskeletal: No lower extremity tenderness nor edema. No gross deformities of extremities. Neurologic:  Normal speech and language. No gross focal neurologic deficits are appreciated. 5/5 strength in the bilateral upper/lower extremities.  Skin:  Skin is warm, dry and intact. No rash noted.   ____________________________________________   PROCEDURES  Procedure(s) performed:   Procedures  None  ____________________________________________   INITIAL IMPRESSION / ASSESSMENT AND PLAN / ED COURSE  Pertinent labs & imaging results that were available during my care of the patient were reviewed by me and considered in my medical decision making (see chart for details).   This patient is Presenting for Evaluation of HA, which does require a range of treatment options, and is a complaint that involves a high risk of morbidity and mortality.  The Differential Diagnoses includes but is not exclusive to subarachnoid hemorrhage, meningitis, encephalitis, previous head trauma, cavernous venous thrombosis, muscle tension headache, glaucoma, temporal arteritis, migraine or migraine equivalent, etc.   Critical Interventions-    Medications  sodium chloride 0.9 % bolus 500 mL (0 mLs Intravenous Stopped 10/31/21 0553)  prochlorperazine (COMPAZINE) injection 10 mg (10 mg Intravenous Given 10/31/21 0458)  ketorolac (TORADOL) 30 MG/ML injection 30 mg (30 mg Intravenous Given 10/31/21 0458)  magnesium sulfate IVPB 1 g 100 mL (0 g Intravenous Stopped 10/31/21 0519)    Reassessment after intervention:  Symptoms improved.    Radiologic Tests: Considered neuroimaging but patient reports this is similar to prior migraine headaches with no new features.  Reassuring neuro exam.  Defer  imaging for now.  Social Determinants of Health Risk no smoking history.   Medical Decision  Making: Summary:  Patient presents to the emergency department with headache typical of her migraines.  No focal neuro deficits or other concerning features. Plan for symptom mgmt and reassess.   Reevaluation with update and discussion with patient. She is feeling much better after treatment. Plan for d/c home with PCP follow up plan.   Disposition: discharge   ____________________________________________  FINAL CLINICAL IMPRESSION(S) / ED DIAGNOSES  Final diagnoses:  Other migraine without status migrainosus, not intractable     NEW OUTPATIENT MEDICATIONS STARTED DURING THIS VISIT:  New Prescriptions   PROCHLORPERAZINE (COMPAZINE) 10 MG TABLET    Take 1 tablet (10 mg total) by mouth every 6 (six) hours as needed for nausea or vomiting.    Note:  This document was prepared using Dragon voice recognition software and may include unintentional dictation errors.  Nanda Quinton, MD, [facility]    Leevon Upperman, [facility], MD 10/31/21 [phone]

## 2021-10-31 NOTE — ED Triage Notes (Signed)
Reports headache with nausea for the last two days.  Taking ibuprofen with no relief.  Last dose yesterday at noon.

## 2021-11-04 ENCOUNTER — Encounter: Payer: Self-pay | Admitting: Gastroenterology

## 2021-11-04 ENCOUNTER — Other Ambulatory Visit (HOSPITAL_BASED_OUTPATIENT_CLINIC_OR_DEPARTMENT_OTHER): Payer: Self-pay

## 2021-11-04 ENCOUNTER — Other Ambulatory Visit (INDEPENDENT_AMBULATORY_CARE_PROVIDER_SITE_OTHER): Payer: Commercial Managed Care - HMO

## 2021-11-04 ENCOUNTER — Ambulatory Visit (INDEPENDENT_AMBULATORY_CARE_PROVIDER_SITE_OTHER): Payer: Commercial Managed Care - HMO | Admitting: Gastroenterology

## 2021-11-04 VITALS — BP 110/82 | HR 83 | Ht 62.0 in | Wt 193.0 lb

## 2021-11-04 DIAGNOSIS — R112 Nausea with vomiting, unspecified: Secondary | ICD-10-CM

## 2021-11-04 DIAGNOSIS — K581 Irritable bowel syndrome with constipation: Secondary | ICD-10-CM

## 2021-11-04 DIAGNOSIS — R1013 Epigastric pain: Secondary | ICD-10-CM

## 2021-11-04 DIAGNOSIS — B9681 Helicobacter pylori [H. pylori] as the cause of diseases classified elsewhere: Secondary | ICD-10-CM

## 2021-11-04 DIAGNOSIS — K2970 Gastritis, unspecified, without bleeding: Secondary | ICD-10-CM

## 2021-11-04 LAB — CBC WITH DIFFERENTIAL/PLATELET
Basophils Absolute: 0 10*3/uL (ref 0.0–0.1)
Basophils Relative: 0.7 % (ref 0.0–3.0)
Eosinophils Absolute: 0.2 10*3/uL (ref 0.0–0.7)
Eosinophils Relative: 3 % (ref 0.0–5.0)
HCT: 40.3 % (ref 36.0–46.0)
Hemoglobin: 13.4 g/dL (ref 12.0–15.0)
Lymphocytes Relative: 40 % (ref 12.0–46.0)
Lymphs Abs: 2 10*3/uL (ref 0.7–4.0)
MCHC: 33.4 g/dL (ref 30.0–36.0)
MCV: 89.4 fl (ref 78.0–100.0)
Monocytes Absolute: 0.4 10*3/uL (ref 0.1–1.0)
Monocytes Relative: 7.5 % (ref 3.0–12.0)
Neutro Abs: 2.5 10*3/uL (ref 1.4–7.7)
Neutrophils Relative %: 48.8 % (ref 43.0–77.0)
Platelets: 275 10*3/uL (ref 150.0–400.0)
RBC: 4.5 Mil/uL (ref 3.87–5.11)
RDW: 11.9 % (ref 11.5–15.5)
WBC: 5.1 10*3/uL (ref 4.0–10.5)

## 2021-11-04 LAB — COMPREHENSIVE METABOLIC PANEL
ALT: 20 U/L (ref 0–35)
AST: 20 U/L (ref 0–37)
Albumin: 4.2 g/dL (ref 3.5–5.2)
Alkaline Phosphatase: 58 U/L (ref 39–117)
BUN: 10 mg/dL (ref 6–23)
CO2: 23 mEq/L (ref 19–32)
Calcium: 9.4 mg/dL (ref 8.4–10.5)
Chloride: 107 mEq/L (ref 96–112)
Creatinine, Ser: 0.63 mg/dL (ref 0.40–1.20)
GFR: 108.01 mL/min (ref >60.00)
Glucose, Bld: 91 mg/dL (ref 70–99)
Potassium: 3.8 mEq/L (ref 3.5–5.1)
Sodium: 137 mEq/L (ref 135–145)
Total Bilirubin: 0.5 mg/dL (ref 0.2–1.2)
Total Protein: 7.5 g/dL (ref 6.0–8.3)

## 2021-11-04 LAB — LIPASE: Lipase: 39 U/L (ref 11.0–59.0)

## 2021-11-04 MED ORDER — PANTOPRAZOLE SODIUM 40 MG PO TBEC
40.0000 mg | DELAYED_RELEASE_TABLET | Freq: Every day | ORAL | 6 refills | Status: DC
  Filled 2021-11-04: qty 30, 30d supply, fill #0

## 2021-11-04 NOTE — Patient Instructions (Signed)
_______________________________________________________  If you are age 44 or older, your body mass index should be between 23-30. Your Body mass index is 35.3 kg/m. If this is out of the aforementioned range listed, please consider follow up with your Primary Care Provider.  If you are age 59 or younger, your body mass index should be between 19-25. Your Body mass index is 35.3 kg/m. If this is out of the aformentioned range listed, please consider follow up with your Primary Care Provider.   ________________________________________________________  The [facility] providers would like to encourage you to use [facility] to communicate with providers for non-urgent requests or questions.  Due to long hold times on the telephone, sending your provider a message by [facility] may be a faster and more efficient way to get a response.  Please allow 48 business hours for a response.  Please remember that this is for non-urgent requests.  _______________________________________________________  Your provider has requested that you go to the basement level for lab work before leaving today. Press "B" on the elevator. The lab is located at the first door on the left as you exit the elevator.  We have sent the following medications to your pharmacy for you to pick up at your convenience: Protonix. Start when you have submitted your stool test to the lab  Please purchase the following medications over the counter and take as directed: Milk of Magnesium 15 cc daily  You have been scheduled for an abdominal ultrasound at [facility] (1st floor of hospital) on 11-09-2021 at 930am. Please arrive 15 minutes prior to your appointment for registration. Make certain not to have anything to eat or drink midnight prior to your appointment. Should you need to reschedule your appointment, please contact radiology at [phone]. This test typically takes about 30 minutes to perform.  Thank you,  Dr. Jackquline Denmark

## 2021-11-04 NOTE — Progress Notes (Signed)
Chief Complaint: FU  Referring Provider:  [facility]      ASSESSMENT AND PLAN;   #1. Epi pain with N/V. Prev recent EGD with gastritis.  #2. IBS-C, failed miralax  #3. HP gastritis.  -S/P 3 different regimens for H pylori. Last regimen of levo/doxy/protonix x 14 days under ID guidance.   Plan: -protonix '40mg'$  po QD #30, 6 RF -CBC, CMP, lipase -Stool for HP antigen (before starting protonix) -[place] abdo complete -MOM 15 cc po QD -Colon at age 29 with 2-day prep.   HPI:    Ann Andrade is a 46 y.o. female  With H/O breast cancer  C/O postprandial epi pain with intermittent N/V-episodic.  Associated with heartburn and abdominal bloating.  Marked fatty foods.  Denies having any definite intolerance to lactose or gluten.  Seen in ED for mignaine with N/V 10/31/2021- given compazine, trazodone '100mg'$  po QHS  Longstanding history of constipation despite MiraLAX daily and mineral oil as needed. MOM helps the best.  No further rectal bleeding.  Denies having any urgency.  No recent weight loss or loss of appetite.  No nonsteroidals.    Previous GI work-up:  EGD 03/2021 - erosive HP gastritis.  Colon 03/2021 - Preparation of the colon was fair. No circumferential lesions. - The entire examined colon is normal. - Non-bleeding external and internal hemorrhoids. - The examined portion of the ileum was normal. - The examination was otherwise normal on direct - Rpt at age 45 with 2 day prep.   CT AP with contrast 09/18/2020 IMPRESSION: Circumferential bladder wall thickening,  Cholelithiasis. 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 Past Medical History:  Diagnosis Date   Anemia    Anxiety    Asthma    Breast cancer ([place])    Cholelithiasis    H. pylori infection    Internal hemorrhoids     Past Surgical History:  Procedure Laterality Date   c setion      x2   MASTECTOMY Left    TUBAL LIGATION      Family History  Problem Relation Age of Onset   Colon cancer Neg Hx    Esophageal cancer Neg Hx    Rectal cancer Neg Hx    Stomach cancer Neg Hx     Social History   Tobacco Use   Smoking status: Never   Smokeless tobacco: Never  Vaping Use   Vaping Use: Never used  Substance Use Topics   Alcohol use: Never   Drug use: Never    Current Outpatient Medications  Medication Sig Dispense Refill   albuterol (VENTOLIN HFA) 108 (90 Base) MCG/ACT inhaler Inhale 2 puffs into the lungs every 6 (six) hours as needed for wheezing or shortness of breath. (Patient not taking: Reported on 11/04/2021) 6.7 g 2   fluticasone (FLONASE) 50 MCG/ACT nasal spray Place 2 sprays into both nostrils daily. (Patient not taking: Reported on 11/04/2021) 16 g 2   levocetirizine (XYZAL) 5 MG tablet Take 1 tablet (5 mg total) by mouth every evening. (Patient not taking: Reported on 11/04/2021) 32 tablet 5   montelukast (SINGULAIR) 10 MG tablet Take 1 tablet (10 mg total) by mouth at bedtime. (Patient not taking: Reported on 11/04/2021) 32 tablet 5   prochlorperazine (COMPAZINE) 10 MG tablet Take 1 tablet (10 mg total) by mouth every 6 (six) hours as needed for nausea or vomiting. (Patient not taking: Reported on  11/04/2021) 10 tablet 0   No current facility-administered medications for this visit.    No Known Allergies  Review of Systems:  Constitutional: Denies fever, chills, diaphoresis, appetite change and fatigue.  HEENT: Denies photophobia, eye pain, redness, hearing loss, ear pain, congestion, sore throat, rhinorrhea, sneezing, mouth sores, neck pain, neck stiffness and tinnitus.   Respiratory: Denies SOB, DOE, cough, chest tightness,  and wheezing.   Cardiovascular: Denies chest pain, palpitations and leg swelling.  Genitourinary: Denies dysuria, urgency, frequency, hematuria, flank pain and difficulty urinating.  Musculoskeletal: Denies myalgias, back  pain, joint swelling, arthralgias and gait problem.  Skin: No rash.  Neurological: Denies dizziness, seizures, syncope, weakness, light-headedness, numbness and headaches.  Hematological: Denies adenopathy. Easy bruising, personal or family bleeding history  Psychiatric/Behavioral: No anxiety or depression     Physical Exam:    BP 110/82   Pulse 83   Ht '5\' 2"'$  (1.575 m)   Wt 193 lb (87.5 kg)   BMI 35.30 kg/m  Wt Readings from Last 3 Encounters:  11/04/21 193 lb (87.5 kg)  10/31/21 194 lb 0.1 oz (88 kg)  10/25/21 194 lb 1.6 oz (88 kg)   Constitutional:  Well-developed, in no acute distress. Psychiatric: Normal mood and affect. Behavior is normal. HEENT: Pupils normal.  Conjunctivae are normal. No scleral icterus. Neck supple.  Cardiovascular: Normal rate, regular rhythm. No edema Pulmonary/chest: Effort normal and breath sounds normal. No wheezing, rales or rhonchi. Abdominal: Soft, nondistended. Nontender. Bowel sounds active throughout. There are no masses palpable. No hepatomegaly. Rectal: Deferred.  To be performed at the time of colonoscopy. Neurological: Alert and oriented to person place and time. Skin: Skin is warm and dry. No rashes noted.  Data Reviewed: I have personally reviewed following labs and imaging studies  CBC:    Latest Ref Rng & Units 01/11/2021    9:56 PM 09/18/2020    8:09 PM  CBC  WBC 4.0 - 10.5 K/uL 6.5  4.6   Hemoglobin 12.0 - 15.0 g/dL 12.9  12.4   Hematocrit 36.0 - 46.0 % 37.6  36.4   Platelets 150 - 400 K/uL 259  186     CMP:    Latest Ref Rng & Units 01/11/2021    9:56 PM 09/18/2020    8:09 PM  CMP  Glucose 70 - 99 mg/dL 104  99   BUN 6 - 20 mg/dL 22  12   Creatinine 0.44 - 1.00 mg/dL 0.74  0.54   Sodium 135 - 145 mmol/L 136  136   Potassium 3.5 - 5.1 mmol/L 3.5  4.0   Chloride 98 - 111 mmol/L 106  107   CO2 22 - 32 mmol/L 22  23   Calcium 8.9 - 10.3 mg/dL 8.8  8.9   Total Protein 6.5 - 8.1 g/dL 7.3  6.8   Total Bilirubin 0.3 - 1.2  mg/dL 0.4  0.4   Alkaline Phos 38 - 126 U/L 53  45   AST 15 - 41 U/L 22  18   ALT 0 - 44 U/L 21  16         Carmell Austria, MD 11/04/2021, 10:37 AM  [facility]: [facility]

## 2021-11-06 LAB — H. PYLORI ANTIGEN, STOOL: H pylori Ag, Stl: POSITIVE — AB

## 2021-11-08 ENCOUNTER — Telehealth: Payer: Self-pay

## 2021-11-08 DIAGNOSIS — B9681 Helicobacter pylori [H. pylori] as the cause of diseases classified elsewhere: Secondary | ICD-10-CM

## 2021-11-08 NOTE — Telephone Encounter (Signed)
-----   Message from Jackquline Denmark, MD sent at 11/06/2021 11:29 PM EDT ----- Please inform the patient. Re ref to ID please. RE: stool antigen still positive for HP. Send report to family physician

## 2021-11-08 NOTE — Telephone Encounter (Signed)
Spoke to patient and gave her number and address for the referral

## 2021-11-09 ENCOUNTER — Ambulatory Visit (HOSPITAL_COMMUNITY)
Admission: RE | Admit: 2021-11-09 | Discharge: 2021-11-09 | Disposition: A | Discharge status: Home / Self Care | Payer: Commercial Managed Care - HMO | Source: Ambulatory Visit | Attending: Gastroenterology | Admitting: Gastroenterology

## 2021-11-09 DIAGNOSIS — K581 Irritable bowel syndrome with constipation: Secondary | ICD-10-CM | POA: Diagnosis present

## 2021-11-09 DIAGNOSIS — B9681 Helicobacter pylori [H. pylori] as the cause of diseases classified elsewhere: Secondary | ICD-10-CM | POA: Diagnosis present

## 2021-11-09 DIAGNOSIS — K2970 Gastritis, unspecified, without bleeding: Secondary | ICD-10-CM | POA: Insufficient documentation

## 2021-11-09 DIAGNOSIS — R112 Nausea with vomiting, unspecified: Secondary | ICD-10-CM | POA: Diagnosis present

## 2021-11-09 DIAGNOSIS — R1013 Epigastric pain: Secondary | ICD-10-CM

## 2021-11-17 ENCOUNTER — Telehealth: Payer: Self-pay | Admitting: Gastroenterology

## 2021-11-17 ENCOUNTER — Encounter: Payer: Self-pay | Admitting: Internal Medicine

## 2021-11-17 ENCOUNTER — Other Ambulatory Visit: Payer: Self-pay

## 2021-11-17 ENCOUNTER — Ambulatory Visit: Payer: Commercial Managed Care - HMO | Admitting: Internal Medicine

## 2021-11-17 VITALS — BP 112/78 | HR 70 | Resp 16 | Ht 62.0 in | Wt 193.0 lb

## 2021-11-17 DIAGNOSIS — A048 Other specified bacterial intestinal infections: Secondary | ICD-10-CM | POA: Diagnosis not present

## 2021-11-17 DIAGNOSIS — K219 Gastro-esophageal reflux disease without esophagitis: Secondary | ICD-10-CM | POA: Diagnosis not present

## 2021-11-17 DIAGNOSIS — R110 Nausea: Secondary | ICD-10-CM

## 2021-11-17 NOTE — Assessment & Plan Note (Signed)
She is not interested in any treatment for this or any other medications.  I discussed the reasoning for treatment including cancer risk.  She will call back if she is interested in treatment at any time.

## 2021-11-17 NOTE — Progress Notes (Signed)
   Subjective:    Patient ID: Ann Andrade, female    DOB: 07/05/1975, 46 y.o.   MRN: [id number]  HPI She is here for follow up of H pylori infection She has been treated numerous times for this but continues to have the same symptoms.  A stool Ag is again positive from 11/04/21.  She had been intermittently compliant with the regimens and won't take amoxicillin because she reports it makes her itchy. She had an ultrasound by GI and found gallstones with no cholecystitis.  She reports that GI has referred her to general surgery since she remains symptomatic.  Her main symptoms is nausea.  No significant abdominal pain.  She has gained weight.     Review of Systems  Constitutional:  Positive for fatigue.  Gastrointestinal:  Positive for abdominal distention and nausea.  Skin:  Negative for rash.       Objective:   Physical Exam Eyes:     General: No scleral icterus. Pulmonary:     Effort: Pulmonary effort is normal.  Abdominal:     Palpations: Abdomen is soft.  Neurological:     Mental Status: She is alert.   SH: no tobacco        Assessment & Plan:

## 2021-11-17 NOTE — Assessment & Plan Note (Signed)
She continues to have the same symptoms chronically despite past treatments for H pylori.

## 2021-11-17 NOTE — Telephone Encounter (Signed)
This patient stopped by the office after having had her appointment with infectious disease.  She was very hard to understand but said he wanted to give her a pill that she did not want to take.  She said she is in a lot of pain and wants to know if she should go to the ER as our first available appt with an APP is in December as well as Dr. Steve Rattler first available.  Please call patient and advise.  Thank you.

## 2021-11-17 NOTE — Assessment & Plan Note (Signed)
She stopped taking her reflux medications.  Will defer any treatment to GI

## 2021-11-18 NOTE — Telephone Encounter (Signed)
Called and spoke with patient. She has been scheduled for an appt tomorrow with Vicie Mutters, PA-C at 9:30 am. Pt is aware of appt. She had no concerns at the end of the call.

## 2021-11-19 ENCOUNTER — Ambulatory Visit: Payer: Commercial Managed Care - HMO | Admitting: Physician Assistant

## 2021-11-19 ENCOUNTER — Encounter: Payer: Self-pay | Admitting: Physician Assistant

## 2021-11-19 VITALS — BP 98/68 | HR 98 | Ht 62.0 in | Wt 197.2 lb

## 2021-11-19 DIAGNOSIS — R112 Nausea with vomiting, unspecified: Secondary | ICD-10-CM

## 2021-11-19 DIAGNOSIS — R1013 Epigastric pain: Secondary | ICD-10-CM

## 2021-11-19 DIAGNOSIS — K581 Irritable bowel syndrome with constipation: Secondary | ICD-10-CM | POA: Diagnosis not present

## 2021-11-19 DIAGNOSIS — K2970 Gastritis, unspecified, without bleeding: Secondary | ICD-10-CM | POA: Diagnosis not present

## 2021-11-19 DIAGNOSIS — B9681 Helicobacter pylori [H. pylori] as the cause of diseases classified elsewhere: Secondary | ICD-10-CM

## 2021-11-19 DIAGNOSIS — K8020 Calculus of gallbladder without cholecystitis without obstruction: Secondary | ICD-10-CM

## 2021-11-19 MED ORDER — PANTOPRAZOLE SODIUM 40 MG PO TBEC: 40.0000 mg | DELAYED_RELEASE_TABLET | Freq: Two times a day (BID) | ORAL | 0 refills | Status: DC

## 2021-11-19 MED ORDER — DICYCLOMINE HCL 20 MG PO TABS: 20.0000 mg | ORAL_TABLET | Freq: Three times a day (TID) | ORAL | 0 refills | Status: AC | PRN

## 2021-11-19 NOTE — Progress Notes (Signed)
11/19/2021 Ann Andrade [id number] 10-11-1975  Referring provider: [facility] Primary GI doctor: Dr. 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 Cheri Fowler best next medications, long discussion with the patient about H pylori and I think she is willing to proceed with treatment. ?Rifabutin '150mg'$  TID BID, amoxicillin 1 G BID with PPI BID   Epigastric/RUQ pain with nausea and vomiting AB US showed gallstones without inflammation, will refer to [facility] for evaluation  Irritable bowel syndrome with constipation - linzess samples given - instructions how to take given to patient in AVS -can take after Xray results are back to rule out obstruction - lifestyle changes discussed  - add on dicyclomine as needed - consider motegrity if fails  History of Present Illness:  46 y.o. female  with a past medical history of history of breast cancer and others listed below, returns to clinic today for evaluation of H pylori infection, AB pain, N/V.  Long standing history of constipation, on miralax daily and MOM PRN. States she continue to have constipation, BM every other day, has RUQ AB pain when using the restroom, bloating and incomplete Bm's, hard stools. No blood.    History of H pylori s/p treatment x 3, following with ID.  Last treatment levo/doxy/protonix x 14 days under ID guidance, repeated H pylori positive 11/04/21  Last visit with Dr. Linus Salmons 11/17/2021 but she declines pills  She has 2 kids, 21 and 7, she states the pills for H pylori cause her to be too tired to function.  Long discussion with the patient about H pylori, it is difficult to eradicate but if can be contributing to her symptoms and can lead to ulcers and increase risk of stomach cancer.   She continues to have GERD into her throat, has RUQ pain into her back, worse with BM. Has nausea all the time, worse with food and can  have vomiting.    EGD 03/2021 - erosive HP gastritis.   Colon 03/2021 - Preparation of the colon was fair. No circumferential lesions. - The entire examined colon is normal. - Non-bleeding external and internal hemorrhoids. - The examined portion of the ileum was normal. - The examination was otherwise normal on direct - Rpt at age 44 with 2 day prep.   CT AP with contrast 09/18/2020 IMPRESSION: Circumferential bladder wall thickening,  Cholelithiasis. 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   She  reports that she has never smoked. She has never used smokeless tobacco. She reports that she does not drink alcohol and does not use drugs. Her family history is not on file.   Current Medications:     Current Outpatient Medications (Respiratory):    albuterol (VENTOLIN HFA) 108 (90 Base) MCG/ACT inhaler, Inhale 2 puffs into the lungs every 6 (six) hours as needed for wheezing or shortness of breath. (Patient not taking: Reported on 11/04/2021)   fluticasone (FLONASE) 50 MCG/ACT nasal spray, Place 2 sprays into both nostrils daily. (Patient not taking: Reported on 11/04/2021)   levocetirizine (XYZAL) 5 MG tablet, Take 1 tablet (5 mg total) by mouth every evening. (Patient not taking: Reported on 11/04/2021)   montelukast (SINGULAIR) 10 MG tablet, Take 1 tablet (10 mg total) by mouth at bedtime. (Patient not taking: Reported on 11/04/2021)    Current Outpatient Medications (Other):    dicyclomine (  BENTYL) 20 MG tablet, Take 1 tablet (20 mg total) by mouth 3 (three) times daily as needed for spasms.   pantoprazole (PROTONIX) 40 MG tablet, Take 1 tablet (40 mg total) by mouth 2 (two) times daily before a meal.   prochlorperazine (COMPAZINE) 10 MG tablet, Take 1 tablet (10 mg total) by mouth every 6 (six) hours as needed for nausea or vomiting.  Surgical History:  She  has a past surgical  history that includes Mastectomy (Left); c setion; and Tubal ligation.  Current Medications, Allergies, Past Medical History, Past Surgical History, Family History and Social History were reviewed in [employer] record.  Physical Exam: BP 98/68   Pulse 98   Ht '5\' 2"'$  (1.575 m)   Wt 197 lb 3.2 oz (89.4 kg)   LMP  (LMP Unknown)   SpO2 99%   BMI 36.07 kg/m  General:   Pleasant, well developed female in no acute distress Heart : Regular rate and rhythm; no murmurs Pulm: Clear anteriorly; no wheezing Abdomen:  Soft, Obese AB, Sluggish bowel sounds. moderate tenderness in the epigastrium and in the RUQ. Without guarding and Without rebound, No organomegaly appreciated. Rectal: Not evaluated Extremities:  without  edema. Neurologic:  Alert and  oriented x4;  No focal deficits.  Psych:  Cooperative. Normal mood and affect.   Vladimir Crofts, PA-C 11/19/21

## 2021-11-19 NOTE — Patient Instructions (Addendum)
We have sent the following medications to your pharmacy for you to pick up at your convenience:   You have been scheduled for an appointment with Dr. Lyndel Safe on 01/20/21 at 9:30 am . Please arrive 10 minutes early for your appointment.   You will be contacted by [facility] to schedule a consult.  [facility] is located at [address], [address].   Phone No# [phone].   Aumente protonix a [employer], de 30 minutos a una hora antes de la comida.  Linzess *Los pacientes con IBS-E pueden comenzar a experimentar [employer] del dolor abdominal y de los sntomas abdominales generales (dolor, malestar e hinchazn) en aproximadamente 1 semana, y los sntomas generalmente mejoran en 12 semanas.  Tomar al menos 30 minutos antes de la primera comida del da en ayunas Puede tener heces blandas si toma un desayuno rico en grasas. Espere al menos [address], es posible que tenga ms deposiciones durante ese tiempo.  La diarrea debera desaparecer y usted debera comenzar a tener deposiciones normales, completas y completas.  Puede resultar til [employer] tratamiento cuando pueda estar cerca de la comodidad de su propio bao, como un fin de [place]. Una vez que haya salido, podemos enviarle una receta si le fue bien. Hay una tarjeta de receta.  Can take dicyclomine as needed for AB pain up to 3 x a day.  Avoid foods with grease or fatty foods.   Colecistitis Cholecystitis  La colecistitis es la inflamacin de la vescula biliar. A la colecistitis menudo se la llama ataque de la vescula biliar. La vescula biliar es un rgano que tiene forma de pera y se encuentra debajo del hgado, del lado derecho del cuerpo. La vescula biliar almacena un lquido que ayuda al cuerpo a digerir las grasas (la bilis). Si la bilis se acumula en la vescula biliar, esta se inflama y puede desarrollar una infeccin grave. Esta afeccin se puede presentar de manera repentina. La  colecistitis es una afeccin grave y requiere Clinical research associate. Cules son las causas? La causa ms frecuente de esta afeccin son los clculos biliares. Los clculos biliares pueden obstruir los conductos (vas biliares) que transportan la bilis desde la vescula biliar. Esto causa la acumulacin de bilis. Otras causas son: Jacolyn Reedy a la vescula biliar debido a una disminucin del flujo sanguneo. Infeccin en las vas biliares. Cicatrices, obstrucciones o adherencias en las vas biliares. Tumores en el hgado, el pncreas o la vescula biliar. Qu incrementa el riesgo? Es ms probable que sufra esta afeccin si: Es mujer y tiene entre 33 y 7 aos. Toma pldoras anticonceptivas o [place] estrgeno. Toma ciertos medicamentos que aumentan la probabilidad de presentar clculos biliares. Es obeso. Tiene una reaccin grave a una infeccin (sepsis). La infeccin puede ser por bacterias, hongos, parsitos o virus. Ha sido hospitalizado por un traumatismo, como una [place] o una enfermedad grave. No ha comido ni bebido durante un largo perodo de tiempo (ayuno prolongado). Cules son los signos o sntomas? Los sntomas de esta afeccin incluyen: Dolor a la palpacin en la parte superior derecha del abdomen. Bulto sobre la vescula biliar. Distensin del abdomen. Nuseas. Vmitos. Cristy Hilts. Escalofros. Cmo se diagnostica? Esta afeccin se diagnostica mediante una revisin de los antecedentes mdicos y un examen fsico. Tambin pueden hacerle otras pruebas, incluidas las siguientes: Pruebas de diagnstico por imgenes, por ejemplo: Ecografa del abdomen. Exploracin por tomografa computarizada (TC) del abdomen. Exploracin nuclear de la vescula biliar (colecistografa o gammagrafa hepatobiliar [HIDA]). Esta permite  que el mdico observe el trnsito de la bilis desde el hgado a la vescula biliar y Waylan Boga el intestino delgado. Resonancia magntica (RM). Anlisis de [place], tales como: Un  hemograma completo. La cantidad de glbulos blancos puede ser ms elevada de lo normal. Anlisis de protena C reactiva (PCR). El nivel de PCR ser elevado si hay una infeccin. Pruebas funcionales hepticas. Ciertos tipos de clculos biliares hacen que algunos resultados sean ms altos de lo normal. Cmo se trata? El tratamiento puede incluir: Analgsicos y lquidos intravenosos (i.v.). No comer ni beber nada (hacer ayuno). Esto ayuda a Press photographer esfuerzo a Chief Strategy Officer. Antibiticos. Habitualmente, estos se administran a travs de una va i.v. Ciruga para extirpar la vescula biliar (colecistectoma). Drenaje de la vescula biliar. En este procedimiento, se coloca un tubo en la vescula biliar para drenar el lquido. Esta es una opcin para las personas con colecistitis de moderada a grave que no pueden someterse a Leisure centre manager. Siga estas instrucciones en su casa: Medicamentos  Use los medicamentos de venta libre y los recetados solamente como se lo haya indicado el mdico. Si le recetaron un antibitico, tmelo como se lo haya indicado el mdico. No deje de tomar el antibitico aunque comience a sentirse mejor. Instrucciones generales Siga las instrucciones del mdico respecto de qu comer o beber. Cuando le permitan comer, no coma ni beba nada que desencadene los sntomas. No consuma ningn producto que contenga nicotina o tabaco. Estos productos incluyen cigarrillos, tabaco para Higher education careers adviser y aparatos de vapeo, como los Psychologist, sport and exercise. Si necesita ayuda para dejar de fumar, consulte al mdico. Concurra a todas las visitas de seguimiento. Esto es importante. Comunquese con un mdico si: El dolor no se alivia con los [employer]. Tiene fiebre. Solicite ayuda de inmediato si: El dolor se desplaza a otra parte del abdomen o a la espalda. Sigue teniendo sntomas o tiene sntomas nuevos, incluso con Dispensing optician. Estos sntomas pueden Sales executive. Solicite ayuda de  inmediato. Llame al 911. No espere a ver si los sntomas desaparecen. No conduzca por sus propios medios [employer] hospital. Resumen La colecistitis es la inflamacin de la vescula biliar. La causa ms frecuente de esta afeccin son los clculos biliares. Los clculos biliares pueden obstruir los conductos (vas biliares) que transportan la bilis desde la vescula biliar. Los sntomas frecuentes incluyen dolor a la palpacin en el abdomen, nuseas, vmitos, fiebre y escalofros. Esta afeccin se trata con ayuno, analgsicos, ciruga para extirpar la vescula biliar, antibiticos y drenaje de la vescula biliar. Siga las instrucciones de su mdico en relacin con las comidas y bebidas. No coma nada que desencadene los sntomas. Esta informacin no tiene Marine scientist el consejo del mdico. Asegrese de hacerle al mdico cualquier pregunta que tenga. Document Revised: 08/06/2020 Document Reviewed: 08/06/2020 Elsevier Patient Education  [place].

## 2021-11-19 NOTE — Progress Notes (Unsigned)
FOLLOW UP Date of Service/Encounter:  11/22/21   Subjective:  Ann Andrade (DOB: [birth year]) is a 46 y.o. female who returns to the [facility] [place] on 11/22/2021 in re-evaluation of the following: Chronic rhinitis and mild intermittent asthma History obtained from: chart review and patient.  For Review, LV was on 10/25/21  with Dr.Deo Mehringer seen for intial visit for chronic rhinitis and mild intermittent asthma . We were unable to do allergy testing due to recent antihistamine use.  We did start her on an intranasal corticosteroid, Singulair and a daily antihistamine.  We discussed allergy eyedrops.  Her reflux was not controlled he is followed by GI with upcoming appointment.  We continued her on albuterol as needed.  Interim-evaluated by GI on 11/19/2021 and discussed increasing her PPI to twice daily.  Pertinent History/Diagnostics:  Asthma: Intermittent, diagnosed following second pregnancy. -10/21/2021 spirometry: ratio 0.68, 73% FEV1 -moderate obstruction Chronic rhinitis:  Ongoing since childhood, worse starting in 2023.  Characterized by headache and right ear pain.  Not using nasal sprays due to preference. Reflux Followed by GI.  Diagnosed with H. pylori , but remains with uncontrolled reflux.  Also has diagnosis of IBS with constipation.  Today presents for follow-up. She is using her albuterol 3 times per week which is increased for what she typically needs.  She is using flonase and singulair as prescribed. Finds these beneficial.  She is not using allergy eyedrops, did not pick these up at the pharmacy but feels they would be useful. She was found to have gallbladder stones, and is likely to have surgery.  She would be interested in doing allergy injections, but first would like to have her gallbladder removed.  Allergies as of 11/22/2021   No Known Allergies      Medication List        Accurate as of November 22, 2021 10:12 AM. If you have any questions, ask  your nurse or doctor.          albuterol 108 (90 Base) MCG/ACT inhaler Commonly known as: VENTOLIN HFA Inhale 2 puffs into the lungs every 6 (six) hours as needed for wheezing or shortness of breath.   dicyclomine 20 MG tablet Commonly known as: BENTYL Take 1 tablet (20 mg total) by mouth 3 (three) times daily as needed for spasms.   fluticasone 50 MCG/ACT nasal spray Commonly known as: FLONASE Place 2 sprays into both nostrils daily.   levocetirizine 5 MG tablet Commonly known as: XYZAL Take 1 tablet (5 mg total) by mouth every evening.   montelukast 10 MG tablet Commonly known as: Singulair Tome 1 tableta (10 mg en total) por va oral antes de acostarse. (Take 1 tablet (10 mg total) by mouth at bedtime.)   pantoprazole 40 MG tablet Commonly known as: PROTONIX Take 1 tablet (40 mg total) by mouth 2 (two) times daily before a meal.   prochlorperazine 10 MG tablet Commonly known as: COMPAZINE Take 1 tablet (10 mg total) by mouth every 6 (six) hours as needed for nausea or vomiting.       Past Medical History:  Diagnosis Date   Anemia    Anxiety    Asthma    Breast cancer ([place])    Cholelithiasis    H. pylori infection    Internal hemorrhoids    Past Surgical History:  Procedure Laterality Date   c setion     x2   MASTECTOMY Left    TUBAL LIGATION     Otherwise, there  have been no changes to her past medical history, surgical history, family history, or social history.  ROS: All others negative except as noted per HPI.   Objective:  BP 112/66 (BP Location: Left Arm, Patient Position: Sitting, Cuff Size: Normal)   Pulse 91   Temp 98.7 F (37.1 C) (Temporal)   Resp 20   Wt 194 lb (88 kg)   LMP  (LMP Unknown)   SpO2 98%   BMI 35.48 kg/m  Body mass index is 35.48 kg/m. Physical Exam: General Appearance:  Alert, cooperative, no distress, appears stated age  Head:  Normocephalic, without obvious abnormality, atraumatic  Eyes:  Conjunctiva clear, EOM's  intact  Nose: Nares normal, hypertrophic turbinates, normal mucosa, no visible anterior polyps, and septum midline  Throat: Lips, tongue normal; teeth and gums normal, normal posterior oropharynx, tonsils 3+, and no tonsillar exudate  Neck: Supple, symmetrical  Lungs:   clear to auscultation bilaterally, Respirations unlabored, no coughing  Heart:  regular rate and rhythm and no murmur, Appears well perfused  Extremities: No edema  Skin: Skin color, texture, turgor normal, no rashes or lesions on visualized portions of skin  Neurologic: No gross deficits  Skin Testing: Environmental allergy panel and select foods. Adequate positive and negative controls Results discussed with patient/family.  Airborne Adult Perc - 11/22/21 1009     Time Antigen Placed 0930    Allergen Manufacturer Lavella Hammock    Location Back    Number of Test 59    Panel 1 Select    1. Control-Buffer 50% Glycerol Negative    2. Control-Histamine 1 mg/ml 3+    3. Albumin saline Negative    4. McDonald Negative    5. [place] Negative    6. Johnson Negative    7. Montross Blue Negative    8. Meadow Fescue Negative    9. Perennial Rye Negative    10. Sweet Vernal Negative    11. Timothy Negative    12. Cocklebur 4+    13. Burweed Marshelder 3+    14. Ragweed, short 4+    15. Ragweed, Giant 3+    16. Plantain,  English Negative    17. Lamb's Quarters Negative    18. Sheep Sorrell Negative    19. Rough Pigweed 3+    20. Marsh Elder, Rough 3+    21. Mugwort, Common Negative    22. Ash mix Negative    23. Birch mix Negative    24. Beech American Negative    25. Box, Elder Negative    26. Cedar, red Negative    27. Cottonwood, [place] Negative    28. Elm mix Negative    29. Hickory Negative    30. Maple mix Negative    31. Oak, [place] mix Negative    32. Pecan Pollen 4+    33. Pine mix Negative    34. Sycamore Eastern Negative    35. McCracken, Black Pollen Negative    36. Alternaria alternata Negative    37.  Cladosporium Herbarum Negative    38. Aspergillus mix Negative    39. Penicillium mix Negative    40. Bipolaris sorokiniana (Helminthosporium) Negative    41. Drechslera spicifera (Curvularia) Negative    42. Mucor plumbeus Negative    43. Fusarium moniliforme Negative    44. Aureobasidium pullulans (pullulara) Negative    45. Rhizopus oryzae Negative    46. Botrytis cinera Negative    47. Epicoccum nigrum Negative    48. Phoma betae Negative  49. Candida Albicans Negative    50. Trichophyton mentagrophytes Negative    51. Mite, D Farinae  5,000 AU/ml 3+    52. Mite, D Pteronyssinus  5,000 AU/ml Negative    53. Cat Hair 10,000 BAU/ml Negative    54.  Dog Epithelia Negative    55. Mixed Feathers Negative    56. Horse Epithelia Negative    57. Cockroach, German Negative    58. Mouse Negative    59. Tobacco Leaf Negative             Food Perc - 11/22/21 1011       Test Information   Time Antigen Placed 0930    Allergen Manufacturer Lavella Hammock    Location Back    Number of allergen test 10      Food   1. Peanut Negative    2. Soybean food Negative    3. Wheat, whole Negative    4. Sesame Negative    5. Milk, cow Negative    6. Egg White, chicken Negative    7. Casein Negative    8. Shellfish mix Negative    9. Fish mix Negative    10. Cashew Negative             Allergy testing results were read and interpreted by myself, documented by clinical staff.  Assessment/Plan   Intermittent Asthma: not controlled - Rescue Inhaler: Airsupra. Use 2 puffs as needed, maximum 12 puffs per day.   Can also use 15 minutes prior to exercise if you have symptoms with activity. - Asthma is not controlled if:  - Symptoms are occurring >2 times a week OR  - >2 times a month nighttime awakenings  - You are requiring systemic steroids (prednisone/steroid injections) more than once per year  - Your require hospitalization for your asthma.  - Please call the clinic to schedule a  follow up if these symptoms arise  Allergic rhinitis-not controlled - allergy testing today positive to weeds, dust mites, and 1 tree (pecan trees), intradermal positive to grass pollen, tree mix, molds, cat and dog - allergen avoidance - Continue nasal saline spray-2 sprays each nostril twice daily followed by medicated nasal sprays - Continue Nasal Steroid Spray: Options include Flonase (fluticasone), Nasocort (triamcinolone), Nasonex (mometasome) 1- 2 sprays in each nostril daily (can buy over-the-counter if not covered by insurance)  Best results if used daily. - Continue Singulair (Montelukast) '10mg'$  nightly. - Continue over the counter antihistamine daily or daily as needed.   -Your options include Zyrtec (Cetirizine) '10mg'$ , Claritin (Loratadine) '10mg'$ , Allegra (Fexofenadine) '180mg'$ , or Xyzal (Levocetirinze) '5mg'$   Allergic Conjunctivitis: controlled - Consider cromolyn-1 drop each eye up to 4 times daily as needed -Avoid eye drops that say red eye relief as they may contain medications that dry out your eyes.  Reflux-uncontrolled - history of H. Pylori, followed by ID -Gallstones, pending upcoming surgery - continue to follow with GI as scheduled  Follow-up in 12 weeks, sooner if needed.  Sigurd Sos, MD  [facility]

## 2021-11-22 ENCOUNTER — Encounter: Payer: Self-pay | Admitting: Internal Medicine

## 2021-11-22 ENCOUNTER — Telehealth: Payer: Self-pay

## 2021-11-22 ENCOUNTER — Other Ambulatory Visit: Payer: Self-pay

## 2021-11-22 ENCOUNTER — Emergency Department (HOSPITAL_COMMUNITY): Payer: Commercial Managed Care - HMO

## 2021-11-22 ENCOUNTER — Ambulatory Visit: Payer: Commercial Managed Care - HMO | Admitting: Internal Medicine

## 2021-11-22 ENCOUNTER — Emergency Department (HOSPITAL_COMMUNITY)
Admission: EM | Admit: 2021-11-22 | Discharge: 2021-11-22 | Disposition: A | Discharge status: Home / Self Care | Payer: Commercial Managed Care - HMO | Attending: Emergency Medicine | Admitting: Emergency Medicine

## 2021-11-22 VITALS — BP 112/66 | HR 91 | Temp 98.7°F | Resp 20 | Wt 194.0 lb

## 2021-11-22 DIAGNOSIS — H1013 Acute atopic conjunctivitis, bilateral: Secondary | ICD-10-CM | POA: Diagnosis not present

## 2021-11-22 DIAGNOSIS — K219 Gastro-esophageal reflux disease without esophagitis: Secondary | ICD-10-CM

## 2021-11-22 DIAGNOSIS — K8020 Calculus of gallbladder without cholecystitis without obstruction: Secondary | ICD-10-CM | POA: Insufficient documentation

## 2021-11-22 DIAGNOSIS — J4520 Mild intermittent asthma, uncomplicated: Secondary | ICD-10-CM | POA: Diagnosis not present

## 2021-11-22 DIAGNOSIS — R1011 Right upper quadrant pain: Secondary | ICD-10-CM | POA: Diagnosis present

## 2021-11-22 DIAGNOSIS — J3089 Other allergic rhinitis: Secondary | ICD-10-CM | POA: Diagnosis not present

## 2021-11-22 LAB — COMPREHENSIVE METABOLIC PANEL
ALT: 19 U/L (ref 0–44)
AST: 22 U/L (ref 15–41)
Albumin: 3.9 g/dL (ref 3.5–5.0)
Alkaline Phosphatase: 64 U/L (ref 38–126)
Anion gap: 3 — ABNORMAL LOW (ref 5–15)
BUN: 11 mg/dL (ref 6–20)
CO2: 23 mmol/L (ref 22–32)
Calcium: 8.7 mg/dL — ABNORMAL LOW (ref 8.9–10.3)
Chloride: 109 mmol/L (ref 98–111)
Creatinine, Ser: 0.62 mg/dL (ref 0.44–1.00)
GFR, Estimated: >60 mL/min (ref >60)
Glucose, Bld: 103 mg/dL — ABNORMAL HIGH (ref 70–99)
Potassium: 3.6 mmol/L (ref 3.5–5.1)
Sodium: 135 mmol/L (ref 135–145)
Total Bilirubin: 0.6 mg/dL (ref 0.3–1.2)
Total Protein: 7.6 g/dL (ref 6.5–8.1)

## 2021-11-22 LAB — CBC WITH DIFFERENTIAL/PLATELET
Abs Immature Granulocytes: 0.02 10*3/uL (ref 0.00–0.07)
Basophils Absolute: 0 10*3/uL (ref 0.0–0.1)
Basophils Relative: 1 %
Eosinophils Absolute: 0.2 10*3/uL (ref 0.0–0.5)
Eosinophils Relative: 3 %
HCT: 40.9 % (ref 36.0–46.0)
Hemoglobin: 13.8 g/dL (ref 12.0–15.0)
Immature Granulocytes: 0 %
Lymphocytes Relative: 39 %
Lymphs Abs: 2.1 10*3/uL (ref 0.7–4.0)
MCH: 30.3 pg (ref 26.0–34.0)
MCHC: 33.7 g/dL (ref 30.0–36.0)
MCV: 89.9 fL (ref 80.0–100.0)
Monocytes Absolute: 0.4 10*3/uL (ref 0.1–1.0)
Monocytes Relative: 7 %
Neutro Abs: 2.7 10*3/uL (ref 1.7–7.7)
Neutrophils Relative %: 50 %
Platelets: 285 10*3/uL (ref 150–400)
RBC: 4.55 MIL/uL (ref 3.87–5.11)
RDW: 11.7 % (ref 11.5–15.5)
WBC: 5.3 10*3/uL (ref 4.0–10.5)
nRBC: 0 % (ref 0.0–0.2)

## 2021-11-22 LAB — I-STAT BETA HCG BLOOD, ED (MC, WL, AP ONLY): I-stat hCG, quantitative: <5 m[IU]/mL (ref <5)

## 2021-11-22 LAB — LIPASE, BLOOD: Lipase: 44 U/L (ref 11–51)

## 2021-11-22 MED ORDER — ONDANSETRON 8 MG PO TBDP
8.0000 mg | ORAL_TABLET | Freq: Once | ORAL | Status: DC
  Filled 2021-11-22: qty 1

## 2021-11-22 MED ORDER — AIRSUPRA 90-80 MCG/ACT IN AERO: 2.0000 | INHALATION_SPRAY | RESPIRATORY_TRACT | 5 refills | Status: AC | PRN

## 2021-11-22 MED ORDER — OXYCODONE-ACETAMINOPHEN 5-325 MG PO TABS
1.0000 | ORAL_TABLET | Freq: Once | ORAL | Status: DC
  Filled 2021-11-22: qty 1

## 2021-11-22 MED ORDER — CROMOLYN SODIUM 4 % OP SOLN: 1.0000 [drp] | Freq: Four times a day (QID) | OPHTHALMIC | 3 refills | Status: AC | PRN

## 2021-11-22 MED ORDER — FLUTICASONE PROPIONATE 50 MCG/ACT NA SUSP: 2.0000 | Freq: Every day | NASAL | 2 refills | Status: DC

## 2021-11-22 NOTE — ED Triage Notes (Signed)
Pt reports generalized abd pain x1 month with n/v. Pt went to pcp for [place] 2 weeks ago and was referred to ED due to gallbladder issues. Pain worse with food.

## 2021-11-22 NOTE — Telephone Encounter (Signed)
 Ann Crofts, PA-C  , Salvadore Dom, RN  Please call patient, talked with Dr. Lyndel Safe, will try new antibiotics that are less sedating, very important to complete. Rifabutin '150mg'$  TID BID, amoxicillin 1 G BID with her PPI BID for 12 days Repeat HP stool 6-8 weeks with protonix hold x 2 weeks for can do the Diatherix if she wants to stay on PPI . Thanks!

## 2021-11-22 NOTE — Patient Instructions (Addendum)
Intermittent Asthma: - Rescue Inhaler:  Airsupra . Use 2 puffs as needed, maximum 12 puffs per day.   Can also use 15 minutes prior to exercise if you have symptoms with activity. - Asthma is not controlled if:  - Symptoms are occurring >2 times a week OR  - >2 times a month nighttime awakenings  - You are requiring systemic steroids (prednisone/steroid injections) more than once per year  - Your require hospitalization for your asthma.  - Please call the clinic to schedule a follow up if these symptoms arise  Allergic Rhinitis -seasonal and perennial: - allergy testing today positive to weeds, dust mites, and 1 tree (pecan trees), intradermal positive to grass pollen, tree mix, molds, cat and dog - allergen avoidance - Continue nasal saline spray-2 sprays each nostril twice daily followed by medicated nasal sprays - Continue Nasal Steroid Spray: Options include Flonase (fluticasone), Nasocort (triamcinolone), Nasonex (mometasome) 1- 2 sprays in each nostril daily (can buy over-the-counter if not covered by insurance)  Best results if used daily. - Continue Singulair (Montelukast) '10mg'$  nightly. - Continue over the counter antihistamine daily or daily as needed.   -Your options include Zyrtec (Cetirizine) '10mg'$ , Claritin (Loratadine) '10mg'$ , Allegra (Fexofenadine) '180mg'$ , or Xyzal (Levocetirinze) '5mg'$   Allergic Conjunctivitis:  - Consider cromolyn-1 drop each eye up to 4 times daily as needed -Avoid eye drops that say red eye relief as they may contain medications that dry out your eyes.  Reflux- - history of H. Pylori, followed by ID -Gallstones, pending upcoming surgery - continue to follow with GI as scheduled  Follow-up in 12 weeks, sooner if needed.  It was wonderful seeing you again today! Thank you for letting me participate in your care. Sigurd Sos, MD [facility] DUST MITE AVOIDANCE MEASURES:  There are three main measures that need and can be taken to avoid  house dust mites:  Reduce accumulation of dust in general -reduce furniture, clothing, carpeting, books, stuffed animals, especially in bedroom  Separate yourself from the dust -use pillow and mattress encasements (can be found at stores such as [employer] or online) -avoid direct exposure to air condition flow -use a HEPA filter device, especially in the bedroom; you can also use a HEPA filter vacuum cleaner -wipe dust with a moist towel instead of a dry towel or broom when cleaning  Decrease mites and/or their secretions -wash clothing and linen and stuffed animals at highest temperature possible, at least every 2 weeks -stuffed animals can also be placed in a bag and put in a freezer overnight  Despite the above measures, it is impossible to eliminate dust mites or their allergen completely from your home.  With the above measures the burden of mites in your home can be diminished, with the goal of minimizing your allergic symptoms.  Success will be reached only when implementing and using all means together. Reducing Pollen Exposure  The American Academy of Allergy, Asthma and Immunology suggests the following steps to reduce your exposure to pollen during allergy seasons.    Do not hang sheets or clothing out to dry; pollen may collect on these items. Do not mow lawns or spend time around freshly cut grass; mowing stirs up pollen. Keep windows closed at night.  Keep car windows closed while driving. Minimize morning activities outdoors, a time when pollen counts are usually at their highest. Stay indoors as much as possible when pollen counts or humidity is high and on windy days  when pollen tends to remain in the air longer. Use air conditioning when possible.  Many air conditioners have filters that trap the pollen spores. Use a HEPA room air filter to remove pollen form the indoor air you breathe. Control of Mold Allergen   Mold and fungi can grow on a variety of  surfaces provided certain temperature and moisture conditions exist.  Outdoor molds grow on plants, decaying vegetation and soil.  The major outdoor mold, Alternaria and Cladosporium, are found in very high numbers during hot and dry conditions.  Generally, a late Summer - Fall peak is seen for common outdoor fungal spores.  Rain will temporarily lower outdoor mold spore count, but counts rise rapidly when the rainy period ends.  The most important indoor molds are Aspergillus and Penicillium.  Dark, humid and poorly ventilated basements are ideal sites for mold growth.  The next most common sites of mold growth are the bathroom and the kitchen.  Outdoor (Seasonal) Mold Control  Use air conditioning and keep windows closed Avoid exposure to decaying vegetation. Avoid leaf raking. Avoid grain handling. Consider wearing a face mask if working in moldy areas.    Indoor (Perennial) Mold Control   Maintain humidity below 50%. Clean washable surfaces with 5% bleach solution. Remove sources e.g. contaminated carpets.  Control of Dog or Cat Allergen  Avoidance is the best way to manage a dog or cat allergy. If you have a dog or cat and are allergic to dog or cats, consider removing the dog or cat from the home. If you have a dog or cat but don't want to find it a new home, or if your family wants a pet even though someone in the household is allergic, here are some strategies that may help keep symptoms at bay:  Keep the pet out of your bedroom and restrict it to only a few rooms. Be advised that keeping the dog or cat in only one room will not limit the allergens to that room. Don't pet, hug or kiss the dog or cat; if you do, wash your hands with soap and water. High-efficiency particulate air (HEPA) cleaners run continuously in a bedroom or living room can reduce allergen levels over time. Regular use of a high-efficiency vacuum cleaner or a central vacuum can reduce allergen levels. Giving your  dog or cat a bath at least once a week can reduce airborne allergen.

## 2021-11-22 NOTE — ED Notes (Signed)
Pt left stating she has pain medications at home and don't need our medications she need the problem fixed, explained that she needs to follow up with GI and we will give her the information, she declined and walked out.

## 2021-11-22 NOTE — ED Provider Notes (Signed)
[facility] DEPT Provider Note   CSN: [id number] Arrival date & time: 11/22/21  1332     History  Chief Complaint  Patient presents with   Abdominal Pain    Ann Andrade is a 46 y.o. female.   Abdominal Pain    Patient with medical history of anemia, cholelithiasis, H. pylori, GERD presents today due to upper abdominal pain x6 months.  It worsened in the last week.  It used to be only postprandial but now it is all day but worse after eating.  Associated with nausea but no vomiting.  Unable to eat or drink secondary to pain.  Denies any dysuria or hematuria.  She was previously treated for H. pylori, does not feel like it made a big difference.  Status post C-section and tubal ligation and denies any other abdominal surgeries.  Patient was offered Nurse, learning disability but declined.  Patient states she would prefer to have her partner translate.  Patient states "we have been waiting for almost 8 hours we do not need to wait for translator"  Home Medications Prior to Admission medications   Medication Sig Start Date End Date Taking? Authorizing Provider  albuterol (VENTOLIN HFA) 108 (90 Base) MCG/ACT inhaler Inhale 2 puffs into the lungs every 6 (six) hours as needed for wheezing or shortness of breath. 10/25/21   Clemon Chambers, MD  Albuterol-Budesonide [facility]) 90-80 MCG/ACT AERO Inhale 2 puffs into the lungs as needed. Maximum 12 puffs/day 11/22/21   Clemon Chambers, MD  cromolyn (OPTICROM) 4 % ophthalmic solution Place 1 drop into both eyes 4 (four) times daily as needed. 11/22/21   Clemon Chambers, MD  dicyclomine (BENTYL) 20 MG tablet Take 1 tablet (20 mg total) by mouth 3 (three) times daily as needed for spasms. Patient not taking: Reported on 11/22/2021 11/19/21   Vladimir Crofts, PA-C  fluticasone [facility]) 50 MCG/ACT nasal spray Place 2 sprays into both nostrils daily. 11/22/21   Clemon Chambers, MD  levocetirizine (XYZAL) 5 MG tablet Take 1 tablet (5  mg total) by mouth every evening. 10/25/21   Clemon Chambers, MD  montelukast (SINGULAIR) 10 MG tablet Take 1 tablet (10 mg total) by mouth at bedtime. 10/25/21   Clemon Chambers, MD  pantoprazole (PROTONIX) 40 MG tablet Take 1 tablet (40 mg total) by mouth 2 (two) times daily before a meal. 11/19/21 02/17/22  Vladimir Crofts, PA-C  prochlorperazine (COMPAZINE) 10 MG tablet Take 1 tablet (10 mg total) by mouth every 6 (six) hours as needed for nausea or vomiting. 10/31/21   Long, Wonda Olds, MD      Allergies    Patient has no known allergies.    Review of Systems   Review of Systems  Gastrointestinal:  Positive for abdominal pain.    Physical Exam Updated Vital Signs BP 96/85   Pulse 76   Temp 98.1 F (36.7 C) (Oral)   Resp 18   Ht '5\' 2"'$  (1.575 m)   Wt 87.5 kg   LMP  (LMP Unknown)   SpO2 99%   BMI 35.30 kg/m  Physical Exam Vitals and nursing note reviewed. Exam conducted with a chaperone present.  Constitutional:      Appearance: Normal appearance.  HENT:     Head: Normocephalic and atraumatic.  Eyes:     General: No scleral icterus.       Right eye: No discharge.        Left eye: No discharge.  Extraocular Movements: Extraocular movements intact.     Pupils: Pupils are equal, round, and reactive to light.  Cardiovascular:     Rate and Rhythm: Normal rate and regular rhythm.     Pulses: Normal pulses.     Heart sounds: Normal heart sounds. No murmur heard.    No friction rub. No gallop.  Pulmonary:     Effort: Pulmonary effort is normal. No respiratory distress.     Breath sounds: Normal breath sounds.  Abdominal:     General: Abdomen is flat. Bowel sounds are normal. There is no distension.     Palpations: Abdomen is soft.     Tenderness: There is abdominal tenderness in the right upper quadrant. Positive signs include Murphy's sign.  Skin:    General: Skin is warm and dry.     Coloration: Skin is not jaundiced.  Neurological:     Mental Status: She is alert.  Mental status is at baseline.     Coordination: Coordination normal.     ED Results / Procedures / Treatments   Labs (all labs ordered are listed, but only abnormal results are displayed) Labs Reviewed  COMPREHENSIVE METABOLIC PANEL - Abnormal; Notable for the following components:      Result Value   Glucose, Bld 103 (*)    Calcium 8.7 (*)    Anion gap 3 (*)    All other components within normal limits  CBC WITH DIFFERENTIAL/PLATELET  LIPASE, BLOOD  URINALYSIS, ROUTINE W REFLEX MICROSCOPIC  I-STAT BETA HCG BLOOD, ED ([facility], [facility], [facility] ONLY)    EKG None  Radiology US Abdomen Limited RUQ (LIVER/GB)  Result Date: 11/22/2021 CLINICAL DATA:  Epigastric pain, nausea and vomiting for 1 month EXAM: ULTRASOUND ABDOMEN LIMITED RIGHT UPPER QUADRANT COMPARISON:  11/09/2021 FINDINGS: Gallbladder: Mobile gallstones.  No sonographic Murphy sign noted by sonographer. Common bile duct: Diameter: 0.2 cm Liver: No focal lesion identified. Increased parenchymal echogenicity. Portal vein is patent on color Doppler imaging with normal direction of blood flow towards the liver. Other: None. IMPRESSION: 1. Cholelithiasis without evidence of acute cholecystitis. 2. Hepatic steatosis. Electronically Signed   By: Delanna Ahmadi M.D.   On: 11/22/2021 15:43    Procedures Procedures    Medications Ordered in ED Medications  ondansetron (ZOFRAN-ODT) disintegrating tablet 8 mg (8 mg Oral Patient Refused/Not Given 11/22/21 1935)  oxyCODONE-acetaminophen (PERCOCET/ROXICET) 5-325 MG per tablet 1 tablet (1 tablet Oral Patient Refused/Not Given 11/22/21 1935)    ED Course/ Medical Decision Making/ A&P Clinical Course as of 11/22/21 2140  Mon Nov 22, 2021  1937 I consulted with general surgery and spoke with Dr. Ninfa Linden.  Agrees she is not a candidate for emergent surgical intervention.  He states he will reach out to his staff and help facilitate care, secretaries will call the patient tomorrow. [HS]    Clinical  Course User Index [HS] Sherrill Raring, PA-C                           Medical Decision Making Amount and/or Complexity of Data Reviewed Labs: ordered. Radiology: ordered.  Risk Prescription drug management.   Patient presents due to right upper quadrant abdominal pain.  Differential includes but not limited to cholecystitis, symptomatic cholelithiasis, choledocholithiasis, sepsis, pancreatitis, gastritis, perforated ulcer.  Reviewed external medical records including gastroenterology notes.  Patient has history of H. Pylori.  Patient's partner is also at bedside providing independent history.  Patient's abdominal pain is very much right upper quadrant  with positive Murphy sign.  Upper and lower extremity pulses are symmetric bilaterally.  Nonseptic appearing.  Afebrile. -BP 96/85   Pulse 76   Temp 98.1 F (36.7 C) (Oral)   Resp 18   Ht '5\' 2"'$  (1.575 m)   Wt 87.5 kg   LMP  (LMP Unknown)   SpO2 99%   BMI 35.30 kg/m   I ordered, viewed and interpreted laboratory work-up. -CBC without leukocytosis or anemia -CMP without gross electrolyte derangement, AKI or transaminitis -Lipase within normal limits, not consistent with pancreatitis -Not pregnant, not a ruptured ectopic. -UA is pending not currently collected.  I ordered ultrasound to evaluate for cholecystitis.  Reviewed the imaging studies and agree with radiologist that she has cholelithiasis but not consistent with cholecystitis.  I discussed the work-up results with the patient and discussed that I think her symptoms are most likely symptomatic cholelithiasis.  I considered dissection, AAA but not consistent.  Additionally atypical ACS seems unlikely in the absence of chest pain or shortness of breath.  She has no transaminitis, no leukocytosis and is afebrile so again I do not think cholecystitis/in the absence of findings on the ultrasound.  Patient tells me they have been trying to get in touch with [facility]  for scheduling evaluation for possible cholecystectomy but have been having difficulty getting in touch.  State they are on hold for 45 minutes after giving their information today and were disconnected.  I suspect there might be a language barrier difficulties.  I consulted with general surgery regarding this patient as document in the ED course.  I went to reevaluate the patient but was told by the nursing staff that they left AMA.  Unfortunately I am unable to discuss results with him but I expect general surgery will follow-up with him.  I did order pain medicine for the patient but they left prior to receiving it.        Final Clinical Impression(s) / ED Diagnoses Final diagnoses:  Calculus of gallbladder without cholecystitis without obstruction    Rx / DC Orders ED Discharge Orders     None         Sherrill Raring, PA-C 11/22/21 2140    Milton Ferguson, MD 11/24/21 [phone]

## 2021-11-22 NOTE — ED Provider Triage Note (Cosign Needed)
Emergency Medicine Provider Triage Evaluation Note  Ann Andrade , a 45 y.o. female  was evaluated in triage.  Pt complains of right upper quadrant abdominal pain.  Associate with nausea and vomiting, its worse after she eats.  History of ultrasound on 24 October which showed cholelithiasis but no cholecystitis.  Patient is here because is getting worse.  MPRESSION: RUQ [place] 11/09/21 1. Cholelithiasis without evidence of acute cholecystitis. 2. UPPER limits normal hepatic echogenicity which may represent mild hepatic steatosis. 3. No other abnormalities identified. IVC, pancreas and proximal abdominal aorta not well visualized.  .  Review of Systems  Per HPI  Physical Exam  BP 107/71 (BP Location: Left Arm)   Pulse 70   Temp 98.1 F (36.7 C) (Oral)   Resp 16   Ht '5\' 2"'$  (1.575 m)   Wt 87.5 kg   LMP  (LMP Unknown)   SpO2 97%   BMI 35.30 kg/m  Gen:   Awake, no distress   Resp:  Normal effort  MSK:   Moves extremities without difficulty  Other:  RUQ TTP, +murphy  Medical Decision Making  Medically screening exam initiated at 2:06 PM.  Appropriate orders placed.  Ann Andrade was informed that the remainder of the evaluation will be completed by another provider, this initial triage assessment does not replace that evaluation, and the importance of remaining in the ED until their evaluation is complete.     Sherrill Raring, PA-C 11/22/21 1408

## 2021-11-24 ENCOUNTER — Other Ambulatory Visit: Payer: Self-pay

## 2021-11-24 DIAGNOSIS — B9681 Helicobacter pylori [H. pylori] as the cause of diseases classified elsewhere: Secondary | ICD-10-CM

## 2021-11-24 MED ORDER — AMOXICILLIN 500 MG PO CAPS: 1000.0000 mg | ORAL_CAPSULE | Freq: Two times a day (BID) | ORAL | 0 refills | Status: AC

## 2021-11-24 MED ORDER — RIFABUTIN 150 MG PO CAPS: 150.0000 mg | ORAL_CAPSULE | Freq: Two times a day (BID) | ORAL | 0 refills | Status: AC

## 2021-11-24 NOTE — Telephone Encounter (Signed)
Spoke with patient regarding PA & MD recommendations. Prescriptions sent to pharmacy. She's aware that I will give her a call closer to the time of when to submit stool kit & come off PPI. Staff reminder sent to self. Pt verbalized all understanding.

## 2021-11-24 NOTE — Telephone Encounter (Signed)
Left message for patient to call back with interpreter services.

## 2021-11-25 ENCOUNTER — Ambulatory Visit: Payer: Self-pay | Admitting: General Surgery

## 2021-11-25 NOTE — H&P (View-Only) (Signed)
Chief Complaint: New Consultation (gallbladder)       History of Present Illness: Ann Andrade is a 46 y.o. female who is seen today as an office consultation at the request of Dr. Room for evaluation of New Consultation (gallbladder) .     Patient is a 46 year old female comes in secondary to gallstones.  Patient is Spanish-speaking. Patient states that she had multiple days of abdominal pain.  States the right upper quadrant area.  She has some nausea vomiting.  Patient states that she has pain now pretty much with every meal.   She was recently ER.  She did undergo ultrasound and laboratory studies.  Patient with gallstones on ultrasound and LFTs were to the normal limits.   Patient states she has had previous TRAM flap on the left side Left breast reconstruction secondary to breast cancer.         Review of Systems: A complete review of systems was obtained from the patient.  I have reviewed this information and discussed as appropriate with the patient.  See HPI as well for other ROS.   Review of Systems  Constitutional:  Negative for fever.  HENT:  Negative for congestion.   Eyes:  Negative for blurred vision.  Respiratory:  Negative for cough, shortness of breath and wheezing.   Cardiovascular:  Negative for chest pain and palpitations.  Gastrointestinal:  Positive for abdominal pain, nausea and vomiting. Negative for heartburn.  Genitourinary:  Negative for dysuria.  Musculoskeletal:  Negative for myalgias.  Skin:  Negative for rash.  Neurological:  Negative for dizziness and headaches.  Psychiatric/Behavioral:  Negative for depression and suicidal ideas.   All other systems reviewed and are negative.       Medical History: Past Medical History Past Medical History: Diagnosis Date  Anemia    Anxiety    Asthma, unspecified asthma severity, unspecified whether complicated, unspecified whether persistent    GERD (gastroesophageal reflux disease)    History  of cancer       There is no problem list on file for this patient.     Past Surgical History Past Surgical History: Procedure Laterality Date  CESAREAN SECTION      LAPAROSCOPIC TUBAL LIGATION      MASTECTOMY         Allergies No Known Allergies   Current Outpatient Medications on File Prior to Visit Medication Sig Dispense Refill  albuterol 90 mcg/actuation inhaler Inhale into the lungs      albuterol-budesonide (AIRSUPRA) 90-80 mcg/actuation HFAA Inhale into the lungs      amoxicillin (AMOXIL) 500 MG capsule Take by mouth      cromolyn (CROLOM) 4 % ophthalmic solution Apply to eye      dicyclomine (BENTYL) 20 mg tablet Take by mouth      fluticasone propionate (FLONASE) 50 mcg/actuation nasal spray Place into one nostril      levocetirizine (XYZAL) 5 MG tablet Take 5 mg by mouth every evening      montelukast (SINGULAIR) 10 mg tablet Take 10 mg by mouth at bedtime      pantoprazole (PROTONIX) 40 MG DR tablet Take by mouth      prochlorperazine (COMPAZINE) 10 MG tablet Take 10 mg by mouth every 6 (six) hours as needed      rifabutin (MYCOBUTIN) 150 mg capsule Take by mouth       No current facility-administered medications on file prior to visit.     Family History Family History Problem Relation Age of Onset  Breast cancer Father    Breast cancer Sister       Social History   Tobacco Use Smoking Status Never Smokeless Tobacco Never     Social History Social History   Socioeconomic History  Marital status: Married Tobacco Use  Smoking status: Never  Smokeless tobacco: Never Vaping Use  Vaping Use: Never used Substance and Sexual Activity  Alcohol use: Not Currently  Drug use: Never     Objective:     Vitals:   11/25/21 1512 BP: 110/64 Pulse: 104 SpO2: 99% Weight: 89.4 kg (197 lb) Height: 160 cm ('5\' 3"'$ )   Body mass index is 34.9 kg/m.   Physical  Exam Constitutional:      Appearance: Normal appearance.  HENT:     Head: Normocephalic and atraumatic.     Mouth/Throat:     Mouth: Mucous membranes are moist.     Pharynx: Oropharynx is clear.  Eyes:     General: No scleral icterus.    Pupils: Pupils are equal, round, and reactive to light.  Cardiovascular:     Rate and Rhythm: Normal rate and regular rhythm.     Pulses: Normal pulses.     Heart sounds: No murmur heard.    No friction rub. No gallop.  Pulmonary:     Effort: Pulmonary effort is normal. No respiratory distress.     Breath sounds: Normal breath sounds. No stridor.  Abdominal:     General: Abdomen is flat.    untitled image Musculoskeletal:        General: No swelling.  Skin:    General: Skin is warm.  Neurological:     General: No focal deficit present.     Mental Status: She is alert and oriented to person, place, and time. Mental status is at baseline.  Psychiatric:        Mood and Affect: Mood normal.        Thought Content: Thought content normal.        Judgment: Judgment normal.        Assessment and Plan: Diagnoses and all orders for this visit:   Symptomatic cholelithiasis     Ann Andrade is a 46 y.o. female     We will proceed to the OR for a lap cholecystectomy. All risks and benefits were discussed with the patient to generally include: infection, bleeding, possible need for post op ERCP, damage to the bile ducts, and bile leak. Alternatives were offered and described.  All questions were answered and the patient voiced understanding of the procedure and wishes to proceed at this point with a laparoscopic cholecystectomy           No follow-ups on file.   Ralene Ok, MD, [facility], [facility]

## 2021-11-25 NOTE — H&P (Signed)
Chief Complaint: New Consultation (gallbladder)       History of Present Illness: Ann Andrade is a 46 y.o. female who is seen today as an office consultation at the request of Dr. Room for evaluation of New Consultation (gallbladder) .     Patient is a 46 year old female comes in secondary to gallstones.  Patient is Spanish-speaking. Patient states that she had multiple days of abdominal pain.  States the right upper quadrant area.  She has some nausea vomiting.  Patient states that she has pain now pretty much with every meal.   She was recently ER.  She did undergo ultrasound and laboratory studies.  Patient with gallstones on ultrasound and LFTs were to the normal limits.   Patient states she has had previous TRAM flap on the left side Left breast reconstruction secondary to breast cancer.         Review of Systems: A complete review of systems was obtained from the patient.  I have reviewed this information and discussed as appropriate with the patient.  See HPI as well for other ROS.   Review of Systems  Constitutional:  Negative for fever.  HENT:  Negative for congestion.   Eyes:  Negative for blurred vision.  Respiratory:  Negative for cough, shortness of breath and wheezing.   Cardiovascular:  Negative for chest pain and palpitations.  Gastrointestinal:  Positive for abdominal pain, nausea and vomiting. Negative for heartburn.  Genitourinary:  Negative for dysuria.  Musculoskeletal:  Negative for myalgias.  Skin:  Negative for rash.  Neurological:  Negative for dizziness and headaches.  Psychiatric/Behavioral:  Negative for depression and suicidal ideas.   All other systems reviewed and are negative.       Medical History: Past Medical History Past Medical History: Diagnosis Date  Anemia    Anxiety    Asthma, unspecified asthma severity, unspecified whether complicated, unspecified whether persistent    GERD (gastroesophageal reflux disease)    History  of cancer       There is no problem list on file for this patient.     Past Surgical History Past Surgical History: Procedure Laterality Date  CESAREAN SECTION      LAPAROSCOPIC TUBAL LIGATION      MASTECTOMY         Allergies No Known Allergies   Current Outpatient Medications on File Prior to Visit Medication Sig Dispense Refill  albuterol 90 mcg/actuation inhaler Inhale into the lungs      albuterol-budesonide (AIRSUPRA) 90-80 mcg/actuation HFAA Inhale into the lungs      amoxicillin (AMOXIL) 500 MG capsule Take by mouth      cromolyn (CROLOM) 4 % ophthalmic solution Apply to eye      dicyclomine (BENTYL) 20 mg tablet Take by mouth      fluticasone propionate (FLONASE) 50 mcg/actuation nasal spray Place into one nostril      levocetirizine (XYZAL) 5 MG tablet Take 5 mg by mouth every evening      montelukast (SINGULAIR) 10 mg tablet Take 10 mg by mouth at bedtime      pantoprazole (PROTONIX) 40 MG DR tablet Take by mouth      prochlorperazine (COMPAZINE) 10 MG tablet Take 10 mg by mouth every 6 (six) hours as needed      rifabutin (MYCOBUTIN) 150 mg capsule Take by mouth       No current facility-administered medications on file prior to visit.     Family History Family History Problem Relation Age of Onset  Breast cancer Father    Breast cancer Sister       Social History   Tobacco Use Smoking Status Never Smokeless Tobacco Never     Social History Social History   Socioeconomic History  Marital status: Married Tobacco Use  Smoking status: Never  Smokeless tobacco: Never Vaping Use  Vaping Use: Never used Substance and Sexual Activity  Alcohol use: Not Currently  Drug use: Never     Objective:     Vitals:   11/25/21 1512 BP: 110/64 Pulse: 104 SpO2: 99% Weight: 89.4 kg (197 lb) Height: 160 cm ('5\' 3"'$ )   Body mass index is 34.9 kg/m.   Physical  Exam Constitutional:      Appearance: Normal appearance.  HENT:     Head: Normocephalic and atraumatic.     Mouth/Throat:     Mouth: Mucous membranes are moist.     Pharynx: Oropharynx is clear.  Eyes:     General: No scleral icterus.    Pupils: Pupils are equal, round, and reactive to light.  Cardiovascular:     Rate and Rhythm: Normal rate and regular rhythm.     Pulses: Normal pulses.     Heart sounds: No murmur heard.    No friction rub. No gallop.  Pulmonary:     Effort: Pulmonary effort is normal. No respiratory distress.     Breath sounds: Normal breath sounds. No stridor.  Abdominal:     General: Abdomen is flat.    untitled image Musculoskeletal:        General: No swelling.  Skin:    General: Skin is warm.  Neurological:     General: No focal deficit present.     Mental Status: She is alert and oriented to person, place, and time. Mental status is at baseline.  Psychiatric:        Mood and Affect: Mood normal.        Thought Content: Thought content normal.        Judgment: Judgment normal.        Assessment and Plan: Diagnoses and all orders for this visit:   Symptomatic cholelithiasis     Ann Andrade is a 46 y.o. female     We will proceed to the OR for a lap cholecystectomy. All risks and benefits were discussed with the patient to generally include: infection, bleeding, possible need for post op ERCP, damage to the bile ducts, and bile leak. Alternatives were offered and described.  All questions were answered and the patient voiced understanding of the procedure and wishes to proceed at this point with a laparoscopic cholecystectomy           No follow-ups on file.   Ralene Ok, MD, [facility], [facility]

## 2021-12-02 ENCOUNTER — Other Ambulatory Visit (HOSPITAL_BASED_OUTPATIENT_CLINIC_OR_DEPARTMENT_OTHER): Payer: Self-pay

## 2021-12-05 NOTE — Progress Notes (Signed)
Agree with assessment/plan.  Raj Deina Lipsey, MD [facility] [phone]  

## 2021-12-17 ENCOUNTER — Other Ambulatory Visit (HOSPITAL_BASED_OUTPATIENT_CLINIC_OR_DEPARTMENT_OTHER): Payer: Self-pay

## 2021-12-20 ENCOUNTER — Encounter (HOSPITAL_COMMUNITY): Payer: Self-pay | Admitting: General Surgery

## 2021-12-20 ENCOUNTER — Other Ambulatory Visit: Payer: Self-pay

## 2021-12-20 NOTE — Progress Notes (Signed)
I spoke with Ann Andrade using [place], [place].  Ann Andrade denies chest pain or shortness of breath.   Patient denies having any s/s of Covid in her household, also denies any known exposure to Covid.   Ann Andrade 's PCP is with [facility].

## 2021-12-21 ENCOUNTER — Ambulatory Visit (HOSPITAL_COMMUNITY): Payer: Commercial Managed Care - HMO | Admitting: Anesthesiology

## 2021-12-21 ENCOUNTER — Encounter (HOSPITAL_COMMUNITY): Payer: Self-pay | Admitting: General Surgery

## 2021-12-21 ENCOUNTER — Other Ambulatory Visit: Payer: Self-pay

## 2021-12-21 ENCOUNTER — Encounter (HOSPITAL_COMMUNITY)
Admission: RE | Disposition: A | Discharge status: Home / Self Care | Payer: Self-pay | Source: Home / Self Care | Attending: General Surgery

## 2021-12-21 ENCOUNTER — Ambulatory Visit (HOSPITAL_COMMUNITY)
Admission: RE | Admit: 2021-12-21 | Discharge: 2021-12-21 | Disposition: A | Discharge status: Home / Self Care | Payer: Commercial Managed Care - HMO | Attending: General Surgery | Admitting: General Surgery

## 2021-12-21 ENCOUNTER — Ambulatory Visit (HOSPITAL_BASED_OUTPATIENT_CLINIC_OR_DEPARTMENT_OTHER): Payer: Commercial Managed Care - HMO | Admitting: Anesthesiology

## 2021-12-21 DIAGNOSIS — K219 Gastro-esophageal reflux disease without esophagitis: Secondary | ICD-10-CM | POA: Insufficient documentation

## 2021-12-21 DIAGNOSIS — Z853 Personal history of malignant neoplasm of breast: Secondary | ICD-10-CM | POA: Diagnosis not present

## 2021-12-21 DIAGNOSIS — K8010 Calculus of gallbladder with chronic cholecystitis without obstruction: Secondary | ICD-10-CM | POA: Insufficient documentation

## 2021-12-21 HISTORY — DX: Other complications of anesthesia, initial encounter: T88.59XA

## 2021-12-21 HISTORY — PX: CHOLECYSTECTOMY: SHX55

## 2021-12-21 HISTORY — DX: Other specified postprocedural states: Z98.890

## 2021-12-21 LAB — POCT PREGNANCY, URINE: Preg Test, Ur: NEGATIVE

## 2021-12-21 SURGERY — LAPAROSCOPIC CHOLECYSTECTOMY
Anesthesia: General

## 2021-12-21 MED ORDER — SODIUM CHLORIDE 0.9 % IR SOLN
Status: DC | PRN
  Administered 2021-12-21: 500 mL

## 2021-12-21 MED ORDER — ROCURONIUM BROMIDE 10 MG/ML (PF) SYRINGE
PREFILLED_SYRINGE | INTRAVENOUS | Status: AC
  Filled 2021-12-21: qty 10

## 2021-12-21 MED ORDER — EPHEDRINE 5 MG/ML INJ
INTRAVENOUS | Status: AC
  Filled 2021-12-21: qty 5

## 2021-12-21 MED ORDER — BUPIVACAINE HCL (PF) 0.25 % IJ SOLN
INTRAMUSCULAR | Status: AC
  Filled 2021-12-21: qty 30

## 2021-12-21 MED ORDER — ACETAMINOPHEN 500 MG PO TABS
ORAL_TABLET | ORAL | Status: AC
  Administered 2021-12-21: 1000 mg via ORAL
  Filled 2021-12-21: qty 2

## 2021-12-21 MED ORDER — SCOPOLAMINE 1 MG/3DAYS TD PT72
MEDICATED_PATCH | TRANSDERMAL | Status: AC
  Administered 2021-12-21: 1.5 mg via TRANSDERMAL
  Filled 2021-12-21: qty 1

## 2021-12-21 MED ORDER — CHLORHEXIDINE GLUCONATE CLOTH 2 % EX PADS: 6.0000 | MEDICATED_PAD | Freq: Once | CUTANEOUS | Status: DC

## 2021-12-21 MED ORDER — ORAL CARE MOUTH RINSE: 15.0000 mL | Freq: Once | OROMUCOSAL | Status: AC

## 2021-12-21 MED ORDER — PROMETHAZINE HCL 25 MG/ML IJ SOLN
INTRAMUSCULAR | Status: AC
  Filled 2021-12-21: qty 1

## 2021-12-21 MED ORDER — ROCURONIUM BROMIDE 10 MG/ML (PF) SYRINGE
PREFILLED_SYRINGE | INTRAVENOUS | Status: DC | PRN
  Administered 2021-12-21: 60 mg via INTRAVENOUS

## 2021-12-21 MED ORDER — SCOPOLAMINE 1 MG/3DAYS TD PT72: 1.0000 | MEDICATED_PATCH | Freq: Once | TRANSDERMAL | Status: DC

## 2021-12-21 MED ORDER — LIDOCAINE 2% (20 MG/ML) 5 ML SYRINGE
INTRAMUSCULAR | Status: DC | PRN
  Administered 2021-12-21: 80 mg via INTRAVENOUS

## 2021-12-21 MED ORDER — CEFAZOLIN SODIUM-DEXTROSE 2-4 GM/100ML-% IV SOLN
2.0000 g | INTRAVENOUS | Status: AC
  Administered 2021-12-21: 2 g via INTRAVENOUS

## 2021-12-21 MED ORDER — ACETAMINOPHEN 500 MG PO TABS: 1000.0000 mg | ORAL_TABLET | ORAL | Status: AC

## 2021-12-21 MED ORDER — PHENYLEPHRINE 80 MCG/ML (10ML) SYRINGE FOR IV PUSH (FOR BLOOD PRESSURE SUPPORT)
PREFILLED_SYRINGE | INTRAVENOUS | Status: AC
  Filled 2021-12-21: qty 10

## 2021-12-21 MED ORDER — FENTANYL CITRATE (PF) 100 MCG/2ML IJ SOLN
25.0000 ug | INTRAMUSCULAR | Status: DC | PRN
  Administered 2021-12-21: 25 ug via INTRAVENOUS

## 2021-12-21 MED ORDER — CHLORHEXIDINE GLUCONATE 0.12 % MT SOLN
OROMUCOSAL | Status: AC
  Administered 2021-12-21: 15 mL via OROMUCOSAL
  Filled 2021-12-21: qty 15

## 2021-12-21 MED ORDER — 0.9 % SODIUM CHLORIDE (POUR BTL) OPTIME
TOPICAL | Status: DC | PRN
  Administered 2021-12-21: 1000 mL

## 2021-12-21 MED ORDER — FENTANYL CITRATE (PF) 250 MCG/5ML IJ SOLN
INTRAMUSCULAR | Status: AC
  Filled 2021-12-21: qty 5

## 2021-12-21 MED ORDER — CHLORHEXIDINE GLUCONATE 0.12 % MT SOLN: 15.0000 mL | Freq: Once | OROMUCOSAL | Status: AC

## 2021-12-21 MED ORDER — CEFAZOLIN SODIUM-DEXTROSE 2-4 GM/100ML-% IV SOLN
INTRAVENOUS | Status: AC
  Filled 2021-12-21: qty 100

## 2021-12-21 MED ORDER — PROPOFOL 10 MG/ML IV BOLUS
INTRAVENOUS | Status: DC | PRN
  Administered 2021-12-21: 160 mg via INTRAVENOUS

## 2021-12-21 MED ORDER — LIDOCAINE 2% (20 MG/ML) 5 ML SYRINGE
INTRAMUSCULAR | Status: AC
  Filled 2021-12-21: qty 10

## 2021-12-21 MED ORDER — PHENYLEPHRINE 80 MCG/ML (10ML) SYRINGE FOR IV PUSH (FOR BLOOD PRESSURE SUPPORT)
PREFILLED_SYRINGE | INTRAVENOUS | Status: DC | PRN
  Administered 2021-12-21: 80 ug via INTRAVENOUS

## 2021-12-21 MED ORDER — PROMETHAZINE HCL 25 MG/ML IJ SOLN
6.2500 mg | INTRAMUSCULAR | Status: DC | PRN
  Administered 2021-12-21: 12.5 mg via INTRAVENOUS

## 2021-12-21 MED ORDER — MIDAZOLAM HCL 2 MG/2ML IJ SOLN
INTRAMUSCULAR | Status: AC
  Filled 2021-12-21: qty 2

## 2021-12-21 MED ORDER — FENTANYL CITRATE (PF) 100 MCG/2ML IJ SOLN
INTRAMUSCULAR | Status: AC
  Filled 2021-12-21: qty 2

## 2021-12-21 MED ORDER — TRAMADOL HCL 50 MG PO TABS: 50.0000 mg | ORAL_TABLET | Freq: Four times a day (QID) | ORAL | 0 refills | Status: AC | PRN

## 2021-12-21 MED ORDER — FENTANYL CITRATE (PF) 250 MCG/5ML IJ SOLN
INTRAMUSCULAR | Status: DC | PRN
  Administered 2021-12-21: 100 ug via INTRAVENOUS
  Administered 2021-12-21: 50 ug via INTRAVENOUS

## 2021-12-21 MED ORDER — ONDANSETRON HCL 4 MG/2ML IJ SOLN
INTRAMUSCULAR | Status: DC | PRN
  Administered 2021-12-21: 4 mg via INTRAVENOUS

## 2021-12-21 MED ORDER — KETOROLAC TROMETHAMINE 30 MG/ML IJ SOLN
INTRAMUSCULAR | Status: DC | PRN
  Administered 2021-12-21: 30 mg via INTRAVENOUS

## 2021-12-21 MED ORDER — DEXAMETHASONE SODIUM PHOSPHATE 10 MG/ML IJ SOLN
INTRAMUSCULAR | Status: DC | PRN
  Administered 2021-12-21: 10 mg via INTRAVENOUS

## 2021-12-21 MED ORDER — MIDAZOLAM HCL 2 MG/2ML IJ SOLN
INTRAMUSCULAR | Status: DC | PRN
  Administered 2021-12-21 (×2): 1 mg via INTRAVENOUS

## 2021-12-21 MED ORDER — ENSURE PRE-SURGERY PO LIQD: 296.0000 mL | Freq: Once | ORAL | Status: DC

## 2021-12-21 MED ORDER — BUPIVACAINE HCL 0.25 % IJ SOLN
INTRAMUSCULAR | Status: DC | PRN
  Administered 2021-12-21: 10 mL

## 2021-12-21 MED ORDER — LACTATED RINGERS IV SOLN: INTRAVENOUS | Status: DC

## 2021-12-21 MED ORDER — KETOROLAC TROMETHAMINE 30 MG/ML IJ SOLN
INTRAMUSCULAR | Status: AC
  Filled 2021-12-21: qty 1

## 2021-12-21 SURGICAL SUPPLY — 41 items
BAG COUNTER SPONGE SURGICOUNT (BAG) ×1 IMPLANT
CANISTER SUCT 3000ML PPV (MISCELLANEOUS) ×1 IMPLANT
CHLORAPREP W/TINT 26 (MISCELLANEOUS) ×1 IMPLANT
CLIP LIGATING HEMO O LOK GREEN (MISCELLANEOUS) ×1 IMPLANT
COVER SURGICAL LIGHT HANDLE (MISCELLANEOUS) ×1 IMPLANT
COVER TRANSDUCER ULTRASND (DRAPES) ×1 IMPLANT
DERMABOND ADVANCED .7 DNX12 (GAUZE/BANDAGES/DRESSINGS) ×1 IMPLANT
ELECT REM PT RETURN 9FT ADLT (ELECTROSURGICAL) ×1
ELECTRODE REM PT RTRN 9FT ADLT (ELECTROSURGICAL) ×1 IMPLANT
GLOVE BIO SURGEON STRL SZ7.5 (GLOVE) ×1 IMPLANT
GLOVE SURG SYN 7.5  E (GLOVE) ×1
GLOVE SURG SYN 7.5 E (GLOVE) ×1 IMPLANT
GLOVE SURG SYN 7.5 PF PI (GLOVE) ×1 IMPLANT
GOWN STRL REUS W/ TWL LRG LVL3 (GOWN DISPOSABLE) ×2 IMPLANT
GOWN STRL REUS W/ TWL XL LVL3 (GOWN DISPOSABLE) ×1 IMPLANT
GOWN STRL REUS W/TWL LRG LVL3 (GOWN DISPOSABLE) ×2
GOWN STRL REUS W/TWL XL LVL3 (GOWN DISPOSABLE) ×1
GRASPER SUT TROCAR 14GX15 (MISCELLANEOUS) ×1 IMPLANT
KIT BASIN OR (CUSTOM PROCEDURE TRAY) ×1 IMPLANT
KIT TURNOVER KIT B (KITS) ×1 IMPLANT
NDL INSUFFLATION 14GA 120MM (NEEDLE) ×1 IMPLANT
NEEDLE INSUFFLATION 14GA 120MM (NEEDLE) ×1 IMPLANT
NS IRRIG 1000ML POUR BTL (IV SOLUTION) ×1 IMPLANT
PAD ARMBOARD 7.5X6 YLW CONV (MISCELLANEOUS) ×1 IMPLANT
POUCH LAPAROSCOPIC INSTRUMENT (MISCELLANEOUS) ×1 IMPLANT
POUCH RETRIEVAL ECOSAC 10 (ENDOMECHANICALS) IMPLANT
POUCH RETRIEVAL ECOSAC 10MM (ENDOMECHANICALS) ×1
SCISSORS LAP 5X35 DISP (ENDOMECHANICALS) ×1 IMPLANT
SET IRRIG TUBING LAPAROSCOPIC (IRRIGATION / IRRIGATOR) ×1 IMPLANT
SET TUBE SMOKE EVAC HIGH FLOW (TUBING) ×1 IMPLANT
SLEEVE ENDOPATH XCEL 5M (ENDOMECHANICALS) ×1 IMPLANT
SLEEVE Z-THREAD 5X100MM (TROCAR) IMPLANT
SPECIMEN JAR SMALL (MISCELLANEOUS) ×1 IMPLANT
SUT MNCRL AB 4-0 PS2 18 (SUTURE) ×1 IMPLANT
TOWEL GREEN STERILE (TOWEL DISPOSABLE) ×1 IMPLANT
TOWEL GREEN STERILE FF (TOWEL DISPOSABLE) ×1 IMPLANT
TRAY LAPAROSCOPIC MC (CUSTOM PROCEDURE TRAY) ×1 IMPLANT
TROCAR XCEL NON-BLD 11X100MML (ENDOMECHANICALS) ×1 IMPLANT
TROCAR Z-THREAD OPTICAL 5X100M (TROCAR) ×1 IMPLANT
WARMER LAPAROSCOPE (MISCELLANEOUS) ×1 IMPLANT
WATER STERILE IRR 1000ML POUR (IV SOLUTION) ×1 IMPLANT

## 2021-12-21 NOTE — Interval H&P Note (Signed)
History and Physical Interval Note:  12/21/2021 1:02 PM  Ann Andrade  has presented today for surgery, with the diagnosis of GALLSTONES.  The various methods of treatment have been discussed with the patient and family. After consideration of risks, benefits and other options for treatment, the patient has consented to  Procedure(s): LAPAROSCOPIC CHOLECYSTECTOMY (N/A) as a surgical intervention.  The patient's history has been reviewed, patient examined, no change in status, stable for surgery.  I have reviewed the patient's chart and labs.  Questions were answered to the patient's satisfaction.     Ralene Ok

## 2021-12-21 NOTE — Op Note (Signed)
12/21/2021  2:13 PM  PATIENT:  Ann Andrade  46 y.o. female  PRE-OPERATIVE DIAGNOSIS:  GALLSTONES  POST-OPERATIVE DIAGNOSIS:  GALLSTONES, CHRONIC CHOLECYSTITIS  PROCEDURE:  Procedure(s): LAPAROSCOPIC CHOLECYSTECTOMY (N/A)  SURGEON:  Surgeon(s) and Role:    * Ralene Ok, MD - Primary  ASSISTANTS: Dr. Marja Kays, PGY-5   ANESTHESIA:   local and general  EBL:  minimal   BLOOD ADMINISTERED:none  DRAINS: none   LOCAL MEDICATIONS USED:  BUPIVICAINE   SPECIMEN:  Source of Specimen:  gallbladder  DISPOSITION OF SPECIMEN:  PATHOLOGY  COUNTS:  YES  TOURNIQUET:  * No tourniquets in log *  DICTATION: .Dragon Dictation  The patient was taken to the operating and placed in the supine position with bilateral SCDs in place.  The patient was prepped and draped in the usual sterile fashion. A time out was called and all facts were verified. A pneumoperitoneum was obtained via A Veress needle technique to a pressure of 67m of mercury.  A 578mtrochar was then placed in the right upper quadrant under visualization, and there were no injuries to any abdominal organs. A 11 mm port was then placed in the umbilical region after infiltrating with local anesthesia under direct visualization. A second and third epigastric port and right lower quadrant port placement under direct visualization, respectively.    The gallbladder was identified and retracted, the peritoneum was then sharply dissected from the gallbladder and this dissection was carried down to Calot's triangle. The cystic duct was identified and stripped away circumferentially and seen going into the gallbladder 360, the critical angle was obtained.  2 clips were placed proximally one distally and the cystic duct transected. The cystic artery was identified and 2 clips placed proximally and one distally and transected.  We then proceeded to remove the gallbladder off the hepatic fossa with Bovie cautery. A retrieval bag was then  placed in the abdomen and gallbladder placed in the bag. The hepatic fossa was then reexamined and hemostasis was achieved with Bovie cautery and was excellent at the end of the case.   The subhepatic fossa and perihepatic fossa was then irrigated until the effluent was clear.  The gallbladder and bag were removed from the abdominal cavity. The 11 mm trocar fascia was reapproximated with the Endo Close #1 Vicryl x2.  The pneumoperitoneum was evacuated and all trochars removed under direct visulalization.  The skin was then closed with 4-0 Monocryl and the skin dressed with Dermabond.    The patient was awaken from general anesthesia and taken to the recovery room in stable condition.   I was personally present during the key and critical portions of this procedure and immediately available throughout the entire procedure, as documented in my operative note.   PLAN OF CARE: Discharge to home after PACU  PATIENT DISPOSITION:  PACU - hemodynamically stable.   Delay start of Pharmacological VTE agent (>24hrs) due to surgical blood loss or risk of bleeding: not applicable

## 2021-12-21 NOTE — Transfer of Care (Signed)
Immediate Anesthesia Transfer of Care Note  Patient: Ann Andrade  Procedure(s) Performed: LAPAROSCOPIC CHOLECYSTECTOMY  Patient Location: PACU  Anesthesia Type:General  Level of Consciousness: awake, alert , and oriented  Airway & Oxygen Therapy: Patient Spontanous Breathing and Patient connected to nasal cannula oxygen  Post-op Assessment: Report given to RN, Post -op Vital signs reviewed and stable, and Patient moving all extremities X 4  Post vital signs: Reviewed and stable  Last Vitals:  Vitals Value Taken Time  BP 128/82 12/21/21 1433  Temp    Pulse 75 12/21/21 1436  Resp 20 12/21/21 1436  SpO2 99 % 12/21/21 1436  Vitals shown include unvalidated device data.  Last Pain:  Vitals:   12/21/21 1234  TempSrc:   PainSc: 0-No pain         Complications: No notable events documented.

## 2021-12-21 NOTE — Discharge Instructions (Signed)
[facility] ______[facility] LAPAROSCOPIC SURGERY: POST OP INSTRUCTIONS Always review your discharge instruction sheet given to you by the facility where your surgery was performed. IF YOU HAVE DISABILITY OR FAMILY LEAVE FORMS, YOU MUST BRING THEM TO THE OFFICE FOR PROCESSING.   DO NOT GIVE THEM TO YOUR DOCTOR.  A prescription for pain medication may be given to you upon discharge.  Take your pain medication as prescribed, if needed.  If narcotic pain medicine is not needed, then you may take acetaminophen (Tylenol) or ibuprofen (Advil) as needed. Take your usually prescribed medications unless otherwise directed. If you need a refill on your pain medication, please contact your pharmacy.  They will contact our office to request authorization. Prescriptions will not be filled after 5pm or on week-ends. You should follow a light diet the first few days after arrival home, such as soup and crackers, etc.  Be sure to include lots of fluids daily. Most patients will experience some swelling and bruising in the area of the incisions.  Ice packs will help.  Swelling and bruising can take several days to resolve.  It is common to experience some constipation if taking pain medication after surgery.  Increasing fluid intake and taking a stool softener (such as Colace) will usually help or prevent this problem from occurring.  A mild laxative (Milk of Magnesia or Miralax) should be taken according to package instructions if there are no bowel movements after 48 hours. Unless discharge instructions indicate otherwise, you may remove your bandages 24-48 hours after surgery, and you may shower at that time.  You may have steri-strips (small skin tapes) in place directly over the incision.  These strips should be left on the skin for 7-10 days.  If your surgeon used skin glue on the incision, you may shower in 24 hours.  The glue will flake off over the next 2-3 weeks.  Any sutures or staples will be  removed at the office during your follow-up visit. ACTIVITIES:  You may resume regular (light) daily activities beginning the next day--such as daily self-care, walking, climbing stairs--gradually increasing activities as tolerated.  You may have sexual intercourse when it is comfortable.  Refrain from any heavy lifting or straining until approved by your doctor. You may drive when you are no longer taking prescription pain medication, you can comfortably wear a seatbelt, and you can safely maneuver your car and apply brakes. RETURN TO WORK:  __________________________________________________________ You should see your doctor in the office for a follow-up appointment approximately 2-3 weeks after your surgery.  Make sure that you call for this appointment within a day or two after you arrive home to insure a convenient appointment time. OTHER INSTRUCTIONS: __________________________________________________________________________________________________________________________ __________________________________________________________________________________________________________________________ WHEN TO CALL YOUR DOCTOR: Fever over 101.0 Inability to urinate Continued bleeding from incision. Increased pain, redness, or drainage from the incision. Increasing abdominal pain  The clinic staff is available to answer your questions during regular business hours.  Please don't hesitate to call and ask to speak to one of the nurses for clinical concerns.  If you have a medical emergency, go to the nearest emergency room or call 911.  A surgeon from [facility] is always on call at the hospital. [address], [address], [place], [place]  [address] ? [address] [address], [place], [place]   [address] [phone] ? [phone] ? FAX [phone] Web site: [email or web address]  

## 2021-12-21 NOTE — Anesthesia Postprocedure Evaluation (Signed)
Anesthesia Post Note  Patient: Royal Beirne  Procedure(s) Performed: [place]     Patient location during evaluation: PACU Anesthesia Type: General Level of consciousness: awake and alert Pain management: pain level controlled Vital Signs Assessment: post-procedure vital signs reviewed and stable Respiratory status: spontaneous breathing, nonlabored ventilation, respiratory function stable and patient connected to nasal cannula oxygen Cardiovascular status: blood pressure returned to baseline and stable Postop Assessment: no apparent nausea or vomiting Anesthetic complications: no   No notable events documented.  Last Vitals:  Vitals:   12/21/21 1445 12/21/21 1500  BP: 111/81 110/78  Pulse: 77 71  Resp: 15 19  Temp:  (!) 36.4 C  SpO2: 94% 99%    Last Pain:  Vitals:   12/21/21 1500  TempSrc:   PainSc: [place] Donnita Farina

## 2021-12-21 NOTE — Anesthesia Procedure Notes (Signed)
Procedure Name: Intubation Date/Time: 12/21/2021 1:37 PM  Performed by: Mariea Clonts, CRNAPre-anesthesia Checklist: Patient identified, Emergency Drugs available, Suction available and Patient being monitored Patient Re-evaluated:Patient Re-evaluated prior to induction Oxygen Delivery Method: Circle System Utilized Preoxygenation: Pre-oxygenation with 100% oxygen Induction Type: IV induction Ventilation: Mask ventilation without difficulty Laryngoscope Size: Mac and 3 Grade View: Grade II Tube type: Oral Tube size: 7.0 mm Number of attempts: 1 Airway Equipment and Method: Stylet and Oral airway Placement Confirmation: ETT inserted through vocal cords under direct vision, positive ETCO2 and breath sounds checked- equal and bilateral Tube secured with: Tape Dental Injury: Teeth and Oropharynx as per pre-operative assessment

## 2021-12-21 NOTE — Anesthesia Preprocedure Evaluation (Addendum)
Anesthesia Evaluation  Patient identified by MRN, date of birth, ID band Patient awake    Reviewed: Allergy & Precautions, NPO status , Patient's Chart, lab work & pertinent test results  History of Anesthesia Complications (+) PONV and history of anesthetic complications  Airway Mallampati: III  TM Distance: >3 FB Neck ROM: Full    Dental  (+) Teeth Intact, Dental Advisory Given   Pulmonary asthma    Pulmonary exam normal breath sounds clear to auscultation       Cardiovascular negative cardio ROS Normal cardiovascular exam Rhythm:Regular Rate:Normal     Neuro/Psych  PSYCHIATRIC DISORDERS Anxiety     negative neurological ROS     GI/Hepatic ,GERD  Medicated,,Cholelithiasis    Endo/Other  negative endocrine ROS  Obesity   Renal/GU negative Renal ROS     Musculoskeletal negative musculoskeletal ROS (+)    Abdominal   Peds  Hematology negative hematology ROS (+)   Anesthesia Other Findings Day of surgery medications reviewed with the patient.  H/o breast cancer   Reproductive/Obstetrics                             Anesthesia Physical Anesthesia Plan  ASA: 2  Anesthesia Plan: General   Post-op Pain Management: Tylenol PO (pre-op)* and Toradol IV (intra-op)*   Induction: Intravenous  PONV Risk Score and Plan: 4 or greater and Midazolam, Dexamethasone, Ondansetron and Scopolamine patch - Pre-op  Airway Management Planned: Oral ETT  Additional Equipment:   Intra-op Plan:   Post-operative Plan: Extubation in OR  Informed Consent: I have reviewed the patients History and Physical, chart, labs and discussed the procedure including the risks, benefits and alternatives for the proposed anesthesia with the patient or authorized representative who has indicated his/her understanding and acceptance.     Dental advisory given  Plan Discussed with: CRNA  Anesthesia Plan  Comments:         Anesthesia Quick Evaluation

## 2021-12-22 ENCOUNTER — Encounter (HOSPITAL_COMMUNITY): Payer: Self-pay | Admitting: General Surgery

## 2021-12-23 LAB — SURGICAL PATHOLOGY

## 2022-01-18 ENCOUNTER — Telehealth: Payer: Self-pay

## 2022-01-18 NOTE — Telephone Encounter (Signed)
Called to remind patient to submit stool sample for H. Pylori. She's been reminded to come off pantoprazole two weeks prior to submitting sample. Pt has upcoming OV, and will pick up kit then.

## 2022-01-20 ENCOUNTER — Encounter: Payer: Self-pay | Admitting: Gastroenterology

## 2022-01-20 ENCOUNTER — Other Ambulatory Visit (HOSPITAL_BASED_OUTPATIENT_CLINIC_OR_DEPARTMENT_OTHER): Payer: Self-pay

## 2022-01-20 ENCOUNTER — Ambulatory Visit (INDEPENDENT_AMBULATORY_CARE_PROVIDER_SITE_OTHER): Payer: Commercial Managed Care - HMO | Admitting: Gastroenterology

## 2022-01-20 ENCOUNTER — Other Ambulatory Visit: Payer: Commercial Managed Care - HMO

## 2022-01-20 VITALS — BP 120/80 | HR 96 | Ht 64.0 in | Wt 193.0 lb

## 2022-01-20 DIAGNOSIS — R1013 Epigastric pain: Secondary | ICD-10-CM

## 2022-01-20 DIAGNOSIS — B9681 Helicobacter pylori [H. pylori] as the cause of diseases classified elsewhere: Secondary | ICD-10-CM

## 2022-01-20 DIAGNOSIS — K581 Irritable bowel syndrome with constipation: Secondary | ICD-10-CM

## 2022-01-20 DIAGNOSIS — K2970 Gastritis, unspecified, without bleeding: Secondary | ICD-10-CM

## 2022-01-20 DIAGNOSIS — R112 Nausea with vomiting, unspecified: Secondary | ICD-10-CM | POA: Diagnosis not present

## 2022-01-20 MED ORDER — PANTOPRAZOLE SODIUM 40 MG PO TBEC
40.0000 mg | DELAYED_RELEASE_TABLET | Freq: Every day | ORAL | 6 refills | Status: AC
  Filled 2022-01-20: qty 30, 30d supply, fill #0
  Filled 2022-02-16: qty 30, 30d supply, fill #1

## 2022-01-20 MED ORDER — LINACLOTIDE 72 MCG PO CAPS
72.0000 ug | ORAL_CAPSULE | Freq: Every day | ORAL | 4 refills | Status: DC
  Filled 2022-01-20: qty 90, 90d supply, fill #0
  Filled 2022-04-14: qty 90, 90d supply, fill #1
  Filled 2022-07-11: qty 90, 90d supply, fill #2
  Filled 2022-09-26: qty 90, 90d supply, fill #3
  Filled 2022-12-29: qty 90, 90d supply, fill #4

## 2022-01-20 NOTE — Progress Notes (Signed)
Chief Complaint: FU  Referring Provider:  [facility]      ASSESSMENT AND PLAN;   #1. Epi pain with N/V d/t symptomatic cholelithiasis s/p lap chole 12/2021. Now with GERD. Prev EGD 03/2021 with gastritis.  #2. IBS-C, failed miralax  #3. HP gastritis.  -S/P 3 different regimens for H pylori. Last regimen of levo/doxy/protonix x 14 days under ID guidance.   Plan: -Restart protonix '40mg'$  po QD #30, 6 RF -Stool for HP (then restart protonix) -Linzess 49mg PO QD #90.  Samples given. -MOM 15 cc po QD -Minimize nonsteroidals. -Colon at age [age]with 2-day prep. Earlier, if still with problems   HPI:    Ann Maniaciis a 47y.o. female  With H/O breast cancer  S/P recent cholecystectomy 12/21/2021  Doing well except for reflux and constipation  Epi pain, N/V has resolved after cholecystectomy.  She has been taking Motrin 3 times daily.  No narcotics.  She stopped taking Protonix on her own.  No dysphagia.  No fever chills or night sweats no jaundice dark urine or pale stools.  She has been treated for H. pylori while 3 different regimens.  Last regimen under ID guidance was levofloxacin/doxycycline/Protonix for 14 days.  She was scheduled to have stool H. pylori antigen to confirm eradication.  However, she never made it for the test.  Willing to get it done now.   Previous GI work-up:  EGD 03/2021 - erosive HP gastritis.  Colon 03/2021 - Preparation of the colon was fair. No circumferential lesions. - The entire examined colon is normal. - Non-bleeding external and internal hemorrhoids. - The examined portion of the ileum was normal. - The examination was otherwise normal on direct - Rpt at age 89with 2 day prep.   CT AP with contrast 09/18/2020 IMPRESSION: Circumferential bladder wall thickening,  Cholelithiasis. Surgical changes of left breast TRAM flap reconstruction with probable 4.5 cm postoperative seroma within the anterior  abdominal wall. Past Medical History:  Diagnosis Date   Anemia    Anxiety    Asthma    [place]ast cancer ([place]    Cholelithiasis    Complication of anesthesia    H. pylori infection    Internal hemorrhoids    PONV (postoperative nausea and vomiting)     Past Surgical History:  Procedure Laterality Date   c setion     x2   CHOLECYSTECTOMY N/A 12/21/2021   Procedure: LAPAROSCOPIC CHOLECYSTECTOMY;  Surgeon: RRalene Ok MD;  Location: [facility];  Service: General;  Laterality: N/A;   MASTECTOMY Left    TUBAL LIGATION      Family History  Problem Relation Age of Onset   Colon cancer Neg Hx    Esophageal cancer Neg Hx    Rectal cancer Neg Hx    Stomach cancer Neg Hx     Social History   Tobacco Use   Smoking status: Never   Smokeless tobacco: Never  Vaping Use   Vaping Use: Never used  Substance Use Topics   Alcohol use: Never   Drug use: Never    Current Outpatient Medications  Medication Sig Dispense Refill   albuterol (VENTOLIN HFA) 108 (90 Base) MCG/ACT inhaler Inhale 2 puffs into the lungs every 6 (six) hours as needed for wheezing or shortness of breath. 6.7 g 2   Albuterol-Budesonide (AIRSUPRA) 90-80 MCG/ACT AERO Inhale 2 puffs into the lungs as needed. Maximum 12 puffs/day 10.7 g 5   cromolyn (OPTICROM) 4 % ophthalmic  solution Place 1 drop into both eyes 4 (four) times daily as needed. 10 mL 3   dicyclomine (BENTYL) 20 MG tablet Take 1 tablet (20 mg total) by mouth 3 (three) times daily as needed for spasms. 50 tablet 0   levocetirizine (XYZAL) 5 MG tablet Take 1 tablet (5 mg total) by mouth every evening. 32 tablet 5   montelukast (SINGULAIR) 10 MG tablet Take 1 tablet (10 mg total) by mouth at bedtime. 32 tablet 5   pantoprazole (PROTONIX) 40 MG tablet Take 1 tablet (40 mg total) by mouth 2 (two) times daily before a meal. 180 tablet 0   prochlorperazine (COMPAZINE) 10 MG tablet Take 1 tablet (10 mg total) by mouth every 6 (six) hours as needed for nausea or  vomiting. 10 tablet 0   traMADol (ULTRAM) 50 MG tablet Take 1 tablet (50 mg total) by mouth every 6 (six) hours as needed. 20 tablet 0   No current facility-administered medications for this visit.    No Known Allergies  Review of Systems:  neg     Physical Exam:    BP 120/80   Pulse 96   Ht '5\' 4"'$  (1.626 m)   Wt 193 lb (87.5 kg)   SpO2 98%   BMI 33.13 kg/m  Wt Readings from Last 3 Encounters:  01/20/22 193 lb (87.5 kg)  12/21/21 197 lb (89.4 kg)  11/22/21 193 lb (87.5 kg)   Constitutional:  Well-developed, in no acute distress. Psychiatric: Normal mood and affect. Behavior is normal. HEENT: Pupils normal.  Conjunctivae are normal. No scleral icterus. Neck supple.  Cardiovascular: Normal rate, regular rhythm. No edema Pulmonary/chest: Effort normal and breath sounds normal. No wheezing, rales or rhonchi. Abdominal: Soft, nondistended. Nontender. Bowel sounds active throughout. There are no masses palpable. No hepatomegaly. Rectal: Deferred.  To be performed at the time of colonoscopy. Neurological: Alert and oriented to person place and time. Skin: Skin is warm and dry. No rashes noted.  Data Reviewed: I have personally reviewed following labs and imaging studies  CBC:    Latest Ref Rng & Units 11/22/2021    2:29 PM 11/04/2021   11:14 AM 01/11/2021    9:56 PM  CBC  WBC 4.0 - 10.5 K/uL 5.3  5.1  6.5   Hemoglobin 12.0 - 15.0 g/dL 13.8  13.4  12.9   Hematocrit 36.0 - 46.0 % 40.9  40.3  37.6   Platelets 150 - 400 K/uL 285  275.0  259     CMP:    Latest Ref Rng & Units 11/22/2021    2:29 PM 11/04/2021   11:14 AM 01/11/2021    9:56 PM  CMP  Glucose 70 - 99 mg/dL 103  91  104   BUN 6 - 20 mg/dL '11  10  22   '$ Creatinine 0.44 - 1.00 mg/dL 0.62  0.63  0.74   Sodium 135 - 145 mmol/L 135  137  136   Potassium 3.5 - 5.1 mmol/L 3.6  3.8  3.5   Chloride 98 - 111 mmol/L 109  107  106   CO2 22 - 32 mmol/L '23  23  22   '$ Calcium 8.9 - 10.3 mg/dL 8.7  9.4  8.8   Total  Protein 6.5 - 8.1 g/dL 7.6  7.5  7.3   Total Bilirubin 0.3 - 1.2 mg/dL 0.6  0.5  0.4   Alkaline Phos 38 - 126 U/L 64  58  53   AST 15 - 41 U/L 22  20  22   ALT 0 - 44 U/L '19  20  21         '$ Carmell Austria, MD 01/20/2022, 9:51 AM  Cc: [facility]

## 2022-01-20 NOTE — Patient Instructions (Addendum)
_______________________________________________________  If you are age 45 or older, your body mass index should be between 23-30. Your Body mass index is 33.13 kg/m. If this is out of the aforementioned range listed, please consider follow up with your Primary Care Provider.  If you are age 34 or younger, your body mass index should be between 19-25. Your Body mass index is 33.13 kg/m. If this is out of the aformentioned range listed, please consider follow up with your Primary Care Provider.   ________________________________________________________  The [place] [facility] providers would like to encourage you to use [facility] to communicate with providers for non-urgent requests or questions.  Due to long hold times on the telephone, sending your provider a message by [facility] may be a faster and more efficient way to get a response.  Please allow 48 business hours for a response.  Please remember that this is for non-urgent requests.  _______________________________________________________  Your provider has requested that you go to the basement level for lab work before leaving today. Press "B" on the elevator. The lab is located at the first door on the left as you exit the elevator.  We have given you samples of the following medication to take: Linzess Lot no [id number]  Exp date 06-2022  We have sent the following medications to your pharmacy for you to pick up at your convenience: Protonix-restart once you complete the stool test Linzess  Please purchase the following medications over the counter and take as directed: M[place] of Magnesium 65m daily  Repeat colon at age 89 Please call at age 89to have this done.  Thank you,  Dr. RJackquline Denmark

## 2022-01-25 ENCOUNTER — Other Ambulatory Visit: Payer: Commercial Managed Care - HMO

## 2022-01-25 DIAGNOSIS — K581 Irritable bowel syndrome with constipation: Secondary | ICD-10-CM

## 2022-01-25 DIAGNOSIS — B9681 Helicobacter pylori [H. pylori] as the cause of diseases classified elsewhere: Secondary | ICD-10-CM

## 2022-01-25 DIAGNOSIS — R112 Nausea with vomiting, unspecified: Secondary | ICD-10-CM

## 2022-01-25 DIAGNOSIS — R1013 Epigastric pain: Secondary | ICD-10-CM

## 2022-01-27 LAB — H. PYLORI ANTIGEN, STOOL: H pylori Ag, Stl: POSITIVE — AB

## 2022-01-31 ENCOUNTER — Other Ambulatory Visit: Payer: Self-pay

## 2022-01-31 DIAGNOSIS — B9681 Helicobacter pylori [H. pylori] as the cause of diseases classified elsewhere: Secondary | ICD-10-CM

## 2022-02-16 ENCOUNTER — Other Ambulatory Visit (HOSPITAL_BASED_OUTPATIENT_CLINIC_OR_DEPARTMENT_OTHER): Payer: Self-pay

## 2022-02-18 ENCOUNTER — Other Ambulatory Visit (HOSPITAL_BASED_OUTPATIENT_CLINIC_OR_DEPARTMENT_OTHER): Payer: Self-pay

## 2022-02-24 ENCOUNTER — Ambulatory Visit: Payer: Commercial Managed Care - HMO | Admitting: Internal Medicine

## 2022-02-24 NOTE — Patient Instructions (Addendum)
Viral uppper respiratory infection Your rapid Covid-19 test today was negative Make sure to get plenty of rest and drink plenty fluids Continue fluticasone nasal spray 1-2 sprays in each nostril once a day for stuffy nose. In the right nostril, point the applicator out toward the right ear. In the left nostril, point the applicator out toward the left ear May use an over the counter antihistamine such as Zyrtec (Cetirizine) 30m, Claritin (Loratadine) 138m Allegra (Fexofenadine) 18041mor Xyzal (Levocetirinze) 5 mg daily May use saline nasal spray or saline rinse. Use prior to any  medicated nasal sprays  Intermittent Asthma: - Rescue Inhaler:  Airsupra . Use 2 puffs as needed, maximum 12 puffs per day.  This will not help with nasal congestion or runny nose, so do not use for these symptoms Can also use 15 minutes prior to exercise if you have symptoms with activity. - Asthma is not controlled if:  - Symptoms are occurring >2 times a week OR  - >2 times a month nighttime awakenings  - You are requiring systemic steroids (prednisone/steroid injections) more than once per year  - Your require hospitalization for your asthma.  - Please call the clinic to schedule a follow up if these symptoms arise  Allergic Rhinitis -seasonal and perennial: - allergy testing on 11/22/21 was positive to weeds, dust mites, and 1 tree (pecan trees), intradermal positive to grass pollen, tree mix, molds, cat and dog -  continue allergen avoidance - Continue nasal saline spray-2 sprays each nostril twice daily followed by medicated nasal sprays - Continue Nasal Steroid Spray: Options include Flonase (fluticasone), Nasocort (triamcinolone), Nasonex (mometasome) 1- 2 sprays in each nostril daily (can buy over-the-counter if not covered by insurance)  Best results if used daily. - Continue Singulair (Montelukast) 78m66mghtly. - Continue over the counter antihistamine daily or daily as needed.   -Your options include  Zyrtec (Cetirizine) 78mg32maritin (Loratadine) 78mg,25megra (Fexofenadine) 180mg, 56myzal (Levocetirinze) 5mg  Al53mgic Conjunctivitis:  - Consider cromolyn-1 drop each eye up to 4 times daily as needed -Avoid eye drops that say red eye relief as they may contain medications that dry out your eyes.  Reflux- - history of H. Pylori, followed by ID - continue to follow with GI as scheduled - continue reflux medicine as per GI  Let us know [place] your symptoms do not get better or you develop a fever  Follow-up in 3 months, sooner if needed.  DUST MITE AVOIDANCE MEASURES:  There are three main measures that need and can be taken to avoid house dust mites:  Reduce accumulation of dust in general -reduce furniture, clothing, carpeting, books, stuffed animals, especially in bedroom  Separate yourself from the dust -use pillow and mattress encasements (can be found at stores such as [employer] or online) -avoid direct exposure to air condition flow -use a HEPA filter device, especially in the bedroom; you can also use a HEPA filter vacuum cleaner -wipe dust with a moist towel instead of a dry towel or broom when cleaning  Decrease mites and/or their secretions -wash clothing and linen and stuffed animals at highest temperature possible, at least every 2 weeks -stuffed animals can also be placed in a bag and put in a freezer overnight  Despite the above measures, it is impossible to eliminate dust mites or their allergen completely from your home.  With the above measures the burden of mites in your home can be diminished, with the goal of minimizing your  allergic symptoms.  Success will be reached only when implementing and using all means together. Reducing Pollen Exposure  The American Academy of Allergy, Asthma and Immunology suggests the following steps to reduce your exposure to pollen during allergy seasons.    Do not hang sheets or clothing out to dry; pollen may collect  on these items. Do not mow lawns or spend time around freshly cut grass; mowing stirs up pollen. Keep windows closed at night.  Keep car windows closed while driving. Minimize morning activities outdoors, a time when pollen counts are usually at their highest. Stay indoors as much as possible when pollen counts or humidity is high and on windy days when pollen tends to remain in the air longer. Use air conditioning when possible.  Many air conditioners have filters that trap the pollen spores. Use a HEPA room air filter to remove pollen form the indoor air you breathe. Control of Mold Allergen   Mold and fungi can grow on a variety of surfaces provided certain temperature and moisture conditions exist.  Outdoor molds grow on plants, decaying vegetation and soil.  The major outdoor mold, Alternaria and Cladosporium, are found in very high numbers during hot and dry conditions.  Generally, a late Summer - Fall peak is seen for common outdoor fungal spores.  Rain will temporarily lower outdoor mold spore count, but counts rise rapidly when the rainy period ends.  The most important indoor molds are Aspergillus and Penicillium.  Dark, humid and poorly ventilated basements are ideal sites for mold growth.  The next most common sites of mold growth are the bathroom and the kitchen.  Outdoor (Seasonal) Mold Control  Use air conditioning and keep windows closed Avoid exposure to decaying vegetation. Avoid leaf raking. Avoid grain handling. Consider wearing a face mask if working in moldy areas.    Indoor (Perennial) Mold Control   Maintain humidity below 50%. Clean washable surfaces with 5% bleach solution. Remove sources e.g. contaminated carpets.  Control of Dog or Cat Allergen  Avoidance is the best way to manage a dog or cat allergy. If you have a dog or cat and are allergic to dog or cats, consider removing the dog or cat from the home. If you have a dog or cat but don't want to find it a  new home, or if your family wants a pet even though someone in the household is allergic, here are some strategies that may help keep symptoms at bay:  Keep the pet out of your bedroom and restrict it to only a few rooms. Be advised that keeping the dog or cat in only one room will not limit the allergens to that room. Don't pet, hug or kiss the dog or cat; if you do, wash your hands with soap and water. High-efficiency particulate air (HEPA) cleaners run continuously in a bedroom or living room can reduce allergen levels over time. Regular use of a high-efficiency vacuum cleaner or a central vacuum can reduce allergen levels. Giving your dog or cat a bath at least once a week can reduce airborne allergen.

## 2022-02-24 NOTE — Progress Notes (Deleted)
FOLLOW UP Date of Service/Encounter:  02/24/22   Subjective:  Ann Andrade (DOB: May 11, 1975) is a 47 y.o. female who returns to the [facility] [place] on 02/24/2022 in re-evaluation of the following: *** History obtained from: chart review and patient.  For Review, LV was on 11/22/21  with Dr.Raymond Azure seen for routine follow-up and allergy testing. Interested in AIT but planning first to have gallbladder surgery. We also started Airsupra as she was using her rescue inhaler 2-3 times per week.   Pertinent History/Diagnostics:  Asthma: Intermittent, diagnosed following second pregnancy. -10/21/2021 spirometry: ratio 0.68, 73% FEV1 -moderate obstruction Allergic rhinitis:  Ongoing since childhood, worse starting in 2023.  Characterized by headache and right ear pain.  Not using nasal sprays due to preference. - 11/22/21 SPT: positive to weeds, dust mites, and 1 tree (pecan trees), intradermal positive to grass pollen, tree mix, molds, cat and dog  Reflux Followed by GI.  Diagnosed with H. pylori , but remains with uncontrolled reflux.  Also has diagnosis of IBS with constipation. S/p laparoscopic cholecystectomy 12/21/21 Most recent H.pylori stool sample positive (01/25/22).   Today presents for follow-up. ***  Allergies as of 02/24/2022   No Known Allergies      Medication List        Accurate as of February 24, 2022 10:26 AM. If you have any questions, ask your nurse or doctor.          Airsupra 90-80 MCG/ACT Aero Generic drug: Albuterol-Budesonide Inhale 2 puffs into the lungs as needed. Maximum 12 puffs/day   albuterol 108 (90 Base) MCG/ACT inhaler Commonly known as: VENTOLIN HFA Inhale 2 puffs into the lungs every 6 (six) hours as needed for wheezing or shortness of breath.   cromolyn 4 % ophthalmic solution Commonly known as: OPTICROM Place 1 drop into both eyes 4 (four) times daily as needed.   dicyclomine 20 MG tablet Commonly known as: BENTYL Take 1  tablet (20 mg total) by mouth 3 (three) times daily as needed for spasms.   levocetirizine 5 MG tablet Commonly known as: XYZAL Take 1 tablet (5 mg total) by mouth every evening.   Linzess 72 MCG capsule Generic drug: linaclotide Take 1 capsule (72 mcg total) by mouth daily before breakfast.   montelukast 10 MG tablet Commonly known as: Singulair Tome 1 tableta (10 mg en total) por va oral antes de acostarse. (Take 1 tablet (10 mg total) by mouth at bedtime.)   pantoprazole 40 MG tablet Commonly known as: PROTONIX Take 1 tablet (40 mg total) by mouth 2 (two) times daily before a meal.   pantoprazole 40 MG tablet Commonly known as: PROTONIX Tome 1 tableta (40 mg en total) por va oral diariamente. (Take 1 tablet (40 mg total) by mouth daily.)   prochlorperazine 10 MG tablet Commonly known as: COMPAZINE Take 1 tablet (10 mg total) by mouth every 6 (six) hours as needed for nausea or vomiting.   traMADol 50 MG tablet Commonly known as: Ultram Take 1 tablet (50 mg total) by mouth every 6 (six) hours as needed.       Past Medical History:  Diagnosis Date   Anemia    Anxiety    Asthma    Breast cancer ([place])    Cholelithiasis    Complication of anesthesia    H. pylori infection    Internal hemorrhoids    PONV (postoperative nausea and vomiting)    Past Surgical History:  Procedure Laterality Date   c setion  x2   CHOLECYSTECTOMY N/A 12/21/2021   Procedure: LAPAROSCOPIC CHOLECYSTECTOMY;  Surgeon: Ralene Ok, MD;  Location: [place];  Service: General;  Laterality: N/A;   MASTECTOMY Left    TUBAL LIGATION     Otherwise, there have been no changes to her past medical history, surgical history, family history, or social history.  ROS: All others negative except as noted per HPI.   Objective:  There were no vitals taken for this visit. There is no height or weight on file to calculate BMI. Physical Exam: General Appearance:  Alert, cooperative, no distress,  appears stated age  Head:  Normocephalic, without obvious abnormality, atraumatic  Eyes:  Conjunctiva clear, EOM's intact  Nose: Nares normal, {Blank multiple:19196:a:"***","hypertrophic turbinates","normal mucosa","no visible anterior polyps","septum midline"}  Throat: Lips, tongue normal; teeth and gums normal, {Blank multiple:19196:a:"***","normal posterior oropharynx","tonsils 2+","tonsils 3+","no tonsillar exudate","+ cobblestoning"}  Neck: Supple, symmetrical  Lungs:   {Blank multiple:19196:a:"***","clear to auscultation bilaterally","end-expiratory wheezing","wheezing throughout"}, Respirations unlabored, {Blank multiple:19196:a:"***","no coughing","intermittent dry coughing"}  Heart:  {Blank multiple:19196:a:"***","regular rate and rhythm","no murmur"}, Appears well perfused  Extremities: No edema  Skin: Skin color, texture, turgor normal, no rashes or lesions on visualized portions of skin  Neurologic: No gross deficits   Reviewed: ***  Spirometry:  Tracings reviewed. Her effort: {Blank single:19197::"Good reproducible efforts.","It was hard to get consistent efforts and there is a question as to whether this reflects a maximal maneuver.","Poor effort, data can not be interpreted.","Variable effort-results affected.","decent for first attempt at spirometry."} FVC: ***L FEV1: ***L, ***% predicted FEV1/FVC ratio: ***% Interpretation: {Blank single:19197::"Spirometry consistent with mild obstructive disease","Spirometry consistent with moderate obstructive disease","Spirometry consistent with severe obstructive disease","Spirometry consistent with possible restrictive disease","Spirometry consistent with mixed obstructive and restrictive disease","Spirometry uninterpretable due to technique","Spirometry consistent with normal pattern","No overt abnormalities noted given today's efforts"}.  Please see scanned spirometry results for details.  Skin Testing: {Blank single:19197::"Select  foods","Environmental allergy panel","Environmental allergy panel and select foods","Food allergy panel","None","Deferred due to recent antihistamines use","deferred due to recent reaction"}. ***Adequate positive and negative controls Results discussed with patient/family.   {Blank single:19197::"Allergy testing results were read and interpreted by myself, documented by clinical staff."," "}  Assessment/Plan   ***  Sigurd Sos, MD  [facility] [place] [facility] [place]

## 2022-02-25 ENCOUNTER — Ambulatory Visit (INDEPENDENT_AMBULATORY_CARE_PROVIDER_SITE_OTHER): Payer: Commercial Managed Care - HMO | Admitting: Family

## 2022-02-25 ENCOUNTER — Encounter: Payer: Self-pay | Admitting: Family

## 2022-02-25 VITALS — BP 110/70 | HR 76 | Temp 97.6°F | Resp 20

## 2022-02-25 DIAGNOSIS — J302 Other seasonal allergic rhinitis: Secondary | ICD-10-CM

## 2022-02-25 DIAGNOSIS — H1013 Acute atopic conjunctivitis, bilateral: Secondary | ICD-10-CM

## 2022-02-25 DIAGNOSIS — J069 Acute upper respiratory infection, unspecified: Secondary | ICD-10-CM

## 2022-02-25 DIAGNOSIS — R6883 Chills (without fever): Secondary | ICD-10-CM

## 2022-02-25 DIAGNOSIS — R0981 Nasal congestion: Secondary | ICD-10-CM

## 2022-02-25 DIAGNOSIS — K219 Gastro-esophageal reflux disease without esophagitis: Secondary | ICD-10-CM

## 2022-02-25 DIAGNOSIS — J4520 Mild intermittent asthma, uncomplicated: Secondary | ICD-10-CM

## 2022-02-25 DIAGNOSIS — J3089 Other allergic rhinitis: Secondary | ICD-10-CM | POA: Diagnosis not present

## 2022-02-25 NOTE — Progress Notes (Signed)
[place] [id number] Dept: [phone]  FOLLOW UP NOTE  Patient ID: Ann Andrade, female    DOB: [birth year]  Age: 47 y.o. MRN: [id number] Date of Office Visit: 02/25/2022  Assessment  Chief Complaint: Sinusitis  HPI Ann Andrade is a 47 year old female who presents today for a possible sinus infection.  She was last seen on November 22, 2021, 2023 by Dr. Simona Huh for intermittent asthma, seasonal and perennial allergic rhinitis, and reflux uncontrolled.  She denies any new diagnosis or surgeries, but after reviewing Epic it looks like she had a cholecystectomy on December 21, 2021. Lelan Pons, an interpreter, is present during the visit.  She reports for the past week she has had a headache, clear rhinorrhea, sinus pressure, chills, sore throat, dry throat, and postnasal drip.  She denies fever and body aches.  She has not been checked for COVID-19 or influenza.  She went to urgent care yesterday,but was not given anything for her symptoms.   She currently only uses Benadryl as needed to help sleep.  She also uses a nasal spray that she thinks is fluticasone nasal spray.  She does take montelukast 10 mg once a day.  Allergic conjunctivitis: She reports itchy watery eyes, but does not use any eyedrops.  Mild intermittent asthma: She reports shortness of breath with exertion sometimes.  She denies cough, wheeze, tightness in chest, and nocturnal awakenings due to breathing problems.  She reports that she has used her AirSupra inhaler for her nasal congestion and this has not helped.  Discussed that this medication would not help with stuffy/runny nose.  Discussed that AirSupra would help with coughing wheezing tightness in her chest and shortness of breath in her chest.  Reflux: She denies heartburn or reflux symptoms.  She reports that she currently takes either Protonix or omeprazole daily.  She continues to follow-up with GI.   Drug Allergies:  No Known Allergies  Review of  Systems: Review of Systems  Constitutional:  Positive for chills. Negative for fever.  HENT:         Reports clear rhinorrhea, sinus pressure, nasal congestion, sore throat, dry throat, and postnasal drip  Respiratory:  Positive for shortness of breath. Negative for cough and wheezing.        Reports shortness of breath with exertion at times.  Denies cough, wheeze, tightness in chest, and nocturnal awakenings due to breathing problems  Cardiovascular:  Negative for chest pain and palpitations.  Gastrointestinal:        Denies heartburn or reflux symptoms.  Either takes omeprazole for Protonix daily  Genitourinary:  Negative for frequency.  Skin:  Negative for itching and rash.  Neurological:  Positive for headaches.  Endo/Heme/Allergies:  Positive for environmental allergies.     Physical Exam: BP 110/70   Pulse 76   Temp 97.6 F (36.4 C) (Temporal)   Resp 20   SpO2 97%    Physical Exam Constitutional:      Appearance: Normal appearance.  HENT:     Head: Normocephalic and atraumatic.     Comments: Pharynx normal, eyes normal, ears normal, nose: Bilateral lower turbinates moderately edematous and pale with clear drainage noted    Right Ear: Tympanic membrane, ear canal and external ear normal.     Left Ear: Tympanic membrane, ear canal and external ear normal.     Mouth/Throat:     Mouth: Mucous membranes are moist.     Pharynx: Oropharynx is clear.  Eyes:  Conjunctiva/sclera: Conjunctivae normal.  Cardiovascular:     Rate and Rhythm: Normal rate and regular rhythm.     Heart sounds: Normal heart sounds.  Pulmonary:     Effort: Pulmonary effort is normal.     Breath sounds: Normal breath sounds.     Comments: Lungs clear to auscultation Musculoskeletal:     Cervical back: Neck supple.  Skin:    General: Skin is warm.  Neurological:     Mental Status: She is alert and oriented to person, place, and time.  Psychiatric:        Mood and Affect: Mood normal.         Behavior: Behavior normal.        Thought Content: Thought content normal.        Judgment: Judgment normal.     Diagnostics:  Your rapid Covid 19 test today was negative  Assessment and Plan: 1. Viral upper respiratory illness   2. Chills (without fever)   3. Nasal congestion   4. Seasonal and perennial allergic rhinitis   5. Allergic conjunctivitis of both eyes   6. Mild intermittent asthma without complication   7. Gastroesophageal reflux disease, unspecified whether esophagitis present     No orders of the defined types were placed in this encounter.   Patient Instructions  Viral uppper respiratory infection Your rapid Covid-19 test today was negative Make sure to get plenty of rest and drink plenty fluids Continue fluticasone nasal spray 1-2 sprays in each nostril once a day for stuffy nose. In the right nostril, point the applicator out toward the right ear. In the left nostril, point the applicator out toward the left ear May use an over the counter antihistamine such as Zyrtec (Cetirizine) 50m, Claritin (Loratadine) 168m Allegra (Fexofenadine) 18049mor Xyzal (Levocetirinze) 5 mg daily May use saline nasal spray or saline rinse. Use prior to any  medicated nasal sprays  Intermittent Asthma: - Rescue Inhaler:  Airsupra . Use 2 puffs as needed, maximum 12 puffs per day.  This will not help with nasal congestion or runny nose, so do not use for these symptoms Can also use 15 minutes prior to exercise if you have symptoms with activity. - Asthma is not controlled if:  - Symptoms are occurring >2 times a week OR  - >2 times a month nighttime awakenings  - You are requiring systemic steroids (prednisone/steroid injections) more than once per year  - Your require hospitalization for your asthma.  - Please call the clinic to schedule a follow up if these symptoms arise  Allergic Rhinitis -seasonal and perennial: - allergy testing on 11/22/21 was positive to weeds, dust  mites, and 1 tree (pecan trees), intradermal positive to grass pollen, tree mix, molds, cat and dog -  continue allergen avoidance - Continue nasal saline spray-2 sprays each nostril twice daily followed by medicated nasal sprays - Continue Nasal Steroid Spray: Options include Flonase (fluticasone), Nasocort (triamcinolone), Nasonex (mometasome) 1- 2 sprays in each nostril daily (can buy over-the-counter if not covered by insurance)  Best results if used daily. - Continue Singulair (Montelukast) 85m81mghtly. - Continue over the counter antihistamine daily or daily as needed.   -Your options include Zyrtec (Cetirizine) 85mg52maritin (Loratadine) 85mg,54megra (Fexofenadine) 180mg, 46myzal (Levocetirinze) 5mg  Al33mgic Conjunctivitis:  - Consider cromolyn-1 drop each eye up to 4 times daily as needed -Avoid eye drops that say red eye relief as they may contain medications that dry out your eyes.  Reflux- -  history of H. Pylori, followed by ID - continue to follow with GI as scheduled - continue reflux medicine as per GI  Let us know if your symptoms do not get better or you develop a fever  Follow-up in 3 months, sooner if needed.  DUST MITE AVOIDANCE MEASURES:  There are three main measures that need and can be taken to avoid house dust mites:  Reduce accumulation of dust in general -reduce furniture, clothing, carpeting, books, stuffed animals, especially in bedroom  Separate yourself from the dust -use pillow and mattress encasements (can be found at stores such as [employer] or online) -avoid direct exposure to air condition flow -use a HEPA filter device, especially in the bedroom; you can also use a HEPA filter vacuum cleaner -wipe dust with a moist towel instead of a dry towel or broom when cleaning  Decrease mites and/or their secretions -wash clothing and linen and stuffed animals at highest temperature possible, at least every 2 weeks -stuffed animals can  also be placed in a bag and put in a freezer overnight  Despite the above measures, it is impossible to eliminate dust mites or their allergen completely from your home.  With the above measures the burden of mites in your home can be diminished, with the goal of minimizing your allergic symptoms.  Success will be reached only when implementing and using all means together. Reducing Pollen Exposure  The American Academy of Allergy, Asthma and Immunology suggests the following steps to reduce your exposure to pollen during allergy seasons.    Do not hang sheets or clothing out to dry; pollen may collect on these items. Do not mow lawns or spend time around freshly cut grass; mowing stirs up pollen. Keep windows closed at night.  Keep car windows closed while driving. Minimize morning activities outdoors, a time when pollen counts are usually at their highest. Stay indoors as much as possible when pollen counts or humidity is high and on windy days when pollen tends to remain in the air longer. Use air conditioning when possible.  Many air conditioners have filters that trap the pollen spores. Use a HEPA room air filter to remove pollen form the indoor air you breathe. Control of Mold Allergen   Mold and fungi can grow on a variety of surfaces provided certain temperature and moisture conditions exist.  Outdoor molds grow on plants, decaying vegetation and soil.  The major outdoor mold, Alternaria and Cladosporium, are found in very high numbers during hot and dry conditions.  Generally, a late Summer - Fall peak is seen for common outdoor fungal spores.  Rain will temporarily lower outdoor mold spore count, but counts rise rapidly when the rainy period ends.  The most important indoor molds are Aspergillus and Penicillium.  Dark, humid and poorly ventilated basements are ideal sites for mold growth.  The next most common sites of mold growth are the bathroom and the kitchen.  Outdoor (Seasonal) Mold  Control  Use air conditioning and keep windows closed Avoid exposure to decaying vegetation. Avoid leaf raking. Avoid grain handling. Consider wearing a face mask if working in moldy areas.    Indoor (Perennial) Mold Control   Maintain humidity below 50%. Clean washable surfaces with 5% bleach solution. Remove sources e.g. contaminated carpets.  Control of Dog or Cat Allergen  Avoidance is the best way to manage a dog or cat allergy. If you have a dog or cat and are allergic to dog or cats,  consider removing the dog or cat from the home. If you have a dog or cat but don't want to find it a new home, or if your family wants a pet even though someone in the household is allergic, here are some strategies that may help keep symptoms at bay:  Keep the pet out of your bedroom and restrict it to only a few rooms. Be advised that keeping the dog or cat in only one room will not limit the allergens to that room. Don't pet, hug or kiss the dog or cat; if you do, wash your hands with soap and water. High-efficiency particulate air (HEPA) cleaners run continuously in a bedroom or living room can reduce allergen levels over time. Regular use of a high-efficiency vacuum cleaner or a central vacuum can reduce allergen levels. Giving your dog or cat a bath at least once a week can reduce airborne allergen.    Return in about 3 months (around 05/26/2022), or if symptoms worsen or fail to improve.    Thank you for the opportunity to care for this patient.  Please do not hesitate to contact me with questions.  Althea Charon, FNP [facility]

## 2022-03-31 ENCOUNTER — Other Ambulatory Visit: Payer: Self-pay | Admitting: Gastroenterology

## 2022-03-31 DIAGNOSIS — K259 Gastric ulcer, unspecified as acute or chronic, without hemorrhage or perforation: Secondary | ICD-10-CM

## 2022-04-14 ENCOUNTER — Other Ambulatory Visit (HOSPITAL_BASED_OUTPATIENT_CLINIC_OR_DEPARTMENT_OTHER): Payer: Self-pay

## 2022-05-06 ENCOUNTER — Encounter: Payer: Self-pay | Admitting: Gastroenterology

## 2022-05-06 ENCOUNTER — Ambulatory Visit (INDEPENDENT_AMBULATORY_CARE_PROVIDER_SITE_OTHER): Payer: Commercial Managed Care - HMO | Admitting: Gastroenterology

## 2022-05-06 ENCOUNTER — Other Ambulatory Visit (INDEPENDENT_AMBULATORY_CARE_PROVIDER_SITE_OTHER): Payer: Commercial Managed Care - HMO

## 2022-05-06 VITALS — BP 104/70 | HR 80 | Ht 63.0 in | Wt 189.1 lb

## 2022-05-06 DIAGNOSIS — Z8619 Personal history of other infectious and parasitic diseases: Secondary | ICD-10-CM | POA: Diagnosis not present

## 2022-05-06 DIAGNOSIS — R1030 Lower abdominal pain, unspecified: Secondary | ICD-10-CM

## 2022-05-06 DIAGNOSIS — K2970 Gastritis, unspecified, without bleeding: Secondary | ICD-10-CM

## 2022-05-06 DIAGNOSIS — B9681 Helicobacter pylori [H. pylori] as the cause of diseases classified elsewhere: Secondary | ICD-10-CM

## 2022-05-06 DIAGNOSIS — K581 Irritable bowel syndrome with constipation: Secondary | ICD-10-CM | POA: Diagnosis not present

## 2022-05-06 LAB — CBC WITH DIFFERENTIAL/PLATELET
Basophils Absolute: 0 10*3/uL (ref 0.0–0.1)
Basophils Relative: 0.9 % (ref 0.0–3.0)
Eosinophils Absolute: 0.2 10*3/uL (ref 0.0–0.7)
Eosinophils Relative: 4.4 % (ref 0.0–5.0)
HCT: 39.2 % (ref 36.0–46.0)
Hemoglobin: 13.2 g/dL (ref 12.0–15.0)
Lymphocytes Relative: 40.4 % (ref 12.0–46.0)
Lymphs Abs: 2.2 10*3/uL (ref 0.7–4.0)
MCHC: 33.7 g/dL (ref 30.0–36.0)
MCV: 91.8 fl (ref 78.0–100.0)
Monocytes Absolute: 0.5 10*3/uL (ref 0.1–1.0)
Monocytes Relative: 8.8 % (ref 3.0–12.0)
Neutro Abs: 2.5 10*3/uL (ref 1.4–7.7)
Neutrophils Relative %: 45.5 % (ref 43.0–77.0)
Platelets: 269 10*3/uL (ref 150.0–400.0)
RBC: 4.28 Mil/uL (ref 3.87–5.11)
RDW: 12 % (ref 11.5–15.5)
WBC: 5.4 10*3/uL (ref 4.0–10.5)

## 2022-05-06 LAB — COMPREHENSIVE METABOLIC PANEL
ALT: 20 U/L (ref 0–35)
AST: 21 U/L (ref 0–37)
Albumin: 3.9 g/dL (ref 3.5–5.2)
Alkaline Phosphatase: 60 U/L (ref 39–117)
BUN: 9 mg/dL (ref 6–23)
CO2: 26 mEq/L (ref 19–32)
Calcium: 9.2 mg/dL (ref 8.4–10.5)
Chloride: 105 mEq/L (ref 96–112)
Creatinine, Ser: 0.59 mg/dL (ref 0.40–1.20)
GFR: 109.34 mL/min (ref >60.00)
Glucose, Bld: 91 mg/dL (ref 70–99)
Potassium: 4.1 mEq/L (ref 3.5–5.1)
Sodium: 138 mEq/L (ref 135–145)
Total Bilirubin: 0.4 mg/dL (ref 0.2–1.2)
Total Protein: 7.1 g/dL (ref 6.0–8.3)

## 2022-05-06 LAB — TSH: TSH: 1.14 u[IU]/mL (ref 0.35–5.50)

## 2022-05-06 LAB — C-REACTIVE PROTEIN: CRP: <1 mg/dL (ref 0.5–20.0)

## 2022-05-06 NOTE — Progress Notes (Addendum)
Chief Complaint: FU  Referring Provider:  [facility]      ASSESSMENT AND PLAN;   #1. Lower abdo pain with occ rectal bleeding (attributed to hemorrhoids).  She is due for CRC screening anyway.  #2. IBS-C  #3. Recurrent HP gastritis.  -S/P 3 different regimens for H pylori. Last regimen of levo/doxy/protonix x 14 days under ID guidance.   Plan: -Continue protonix 40mg  po QD #30, 6 RF -Colon with 2 day prep -CBC, CMP, TSH, CRP -Stool antigen for HP ([facility]) -If still with problems, CT Abdo/pelvis with contrast.  Addendum 05/24/2022 H. pylori antigen positive on stool swab. Also positive for clarithromycin resistance. Would send her to ID for retreatment  HPI:    Ann Andrade is a 47 y.o. female  With H/O breast cancer, anxiety, asthma, s/p cholecystectomy 12/2021  Doing well except for lower abdo pain.  More constipated  Now with Occ 1/day diarrhea, some red blood-ever since her cholecystectomy Taking Linzess on as-needed basis  Epi pain, N/V has resolved after cholecystectomy.  No dysphagia.  No fever chills or night sweats no jaundice dark urine or pale stools.  She has been treated for H. pylori while 3 different regimens.  Last regimen under ID guidance was levofloxacin/doxycycline/Protonix for 14 days.     Previous GI work-up:  EGD 03/2021 - erosive HP gastritis.  Colon 03/2021 - Preparation of the colon was fair. No circumferential lesions. - The entire examined colon is normal. - Non-bleeding external and internal hemorrhoids. - The examined portion of the ileum was normal. - The examination was otherwise normal on direct - Rpt at age 84 with 2 day prep.   CT AP with contrast 09/18/2020 IMPRESSION: Circumferential bladder wall thickening,  Cholelithiasis. 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 Past Medical History:  Diagnosis Date    Anemia    Anxiety    Asthma    Breast cancer    Cholelithiasis    Complication of anesthesia    H. pylori infection    Internal hemorrhoids    PONV (postoperative nausea and vomiting)     Past Surgical History:  Procedure Laterality Date   c setion     x2   CHOLECYSTECTOMY N/A 12/21/2021   Procedure: LAPAROSCOPIC CHOLECYSTECTOMY;  Surgeon: Axel Filler, MD;  Location: [facility] OR;  Service: General;  Laterality: N/A;   MASTECTOMY Left    TUBAL LIGATION      Family History  Problem Relation Age of Onset   Colon cancer Neg Hx    Esophageal cancer Neg Hx    Rectal cancer Neg Hx    Stomach cancer Neg Hx     Social History   Tobacco Use   Smoking status: Never   Smokeless tobacco: Never  Vaping Use   Vaping Use: Never used  Substance Use Topics   Alcohol use: Never   Drug use: Never    Current Outpatient Medications  Medication Sig Dispense Refill   albuterol (VENTOLIN HFA) 108 (90 Base) MCG/ACT inhaler Inhale 2 puffs into the lungs every 6 (six) hours as needed for wheezing or shortness of breath. 6.7 g 2   Albuterol-Budesonide (AIRSUPRA) 90-80 MCG/ACT AERO Inhale 2 puffs into the lungs as needed. Maximum 12 puffs/day 10.7 g 5   cromolyn (OPTICROM) 4 % ophthalmic solution Place 1 drop into both eyes 4 (four) times daily as needed. 10 mL 3   dicyclomine (BENTYL)  20 MG tablet Take 1 tablet (20 mg total) by mouth 3 (three) times daily as needed for spasms. 50 tablet 0   levocetirizine (XYZAL) 5 MG tablet Take 1 tablet (5 mg total) by mouth every evening. 32 tablet 5   linaclotide (LINZESS) 72 MCG capsule Take 1 capsule (72 mcg total) by mouth daily before breakfast. 90 capsule 4   montelukast (SINGULAIR) 10 MG tablet Take 1 tablet (10 mg total) by mouth at bedtime. 32 tablet 5   pantoprazole (PROTONIX) 40 MG tablet Take 1 tablet (40 mg total) by mouth daily. 30 tablet 6   prochlorperazine (COMPAZINE) 10 MG tablet Take 1 tablet (10 mg total) by mouth every 6 (six) hours as  needed for nausea or vomiting. 10 tablet 0   traMADol (ULTRAM) 50 MG tablet Take 1 tablet (50 mg total) by mouth every 6 (six) hours as needed. 20 tablet 0   No current facility-administered medications for this visit.    No Known Allergies  Review of Systems:  neg     Physical Exam:    BP 104/70   Pulse 80   Ht 5\' 3"  (1.6 m)   Wt 189 lb 2 oz (85.8 kg)   BMI 33.50 kg/m  Wt Readings from Last 3 Encounters:  05/06/22 189 lb 2 oz (85.8 kg)  01/20/22 193 lb (87.5 kg)  12/21/21 197 lb (89.4 kg)   Constitutional:  Well-developed, in no acute distress. Psychiatric: Normal mood and affect. Behavior is normal. HEENT: Pupils normal.  Conjunctivae are normal. No scleral icterus. Neck supple.  Cardiovascular: Normal rate, regular rhythm. No edema Pulmonary/chest: Effort normal and breath sounds normal. No wheezing, rales or rhonchi. Abdominal: Soft, nondistended. Nontender. Bowel sounds active throughout. There are no masses palpable. No hepatomegaly. Rectal: Deferred.  To be performed at the time of colonoscopy. Neurological: Alert and oriented to person place and time. Skin: Skin is warm and dry. No rashes noted.  Data Reviewed: I have personally reviewed following labs and imaging studies  CBC:    Latest Ref Rng & Units 11/22/2021    2:29 PM 11/04/2021   11:14 AM 01/11/2021    9:56 PM  CBC  WBC 4.0 - 10.5 K/uL 5.3  5.1  6.5   Hemoglobin 12.0 - 15.0 g/dL 69.6  29.5  28.4   Hematocrit 36.0 - 46.0 % 40.9  40.3  37.6   Platelets 150 - 400 K/uL 285  275.0  259     CMP:    Latest Ref Rng & Units 11/22/2021    2:29 PM 11/04/2021   11:14 AM 01/11/2021    9:56 PM  CMP  Glucose 70 - 99 mg/dL 132  91  440   BUN 6 - 20 mg/dL 11  10  22    Creatinine 0.44 - 1.00 mg/dL 1.02  7.25  3.66   Sodium 135 - 145 mmol/L 135  137  136   Potassium 3.5 - 5.1 mmol/L 3.6  3.8  3.5   Chloride 98 - 111 mmol/L 109  107  106   CO2 22 - 32 mmol/L 23  23  22    Calcium 8.9 - 10.3 mg/dL 8.7  9.4   8.8   Total Protein 6.5 - 8.1 g/dL 7.6  7.5  7.3   Total Bilirubin 0.3 - 1.2 mg/dL 0.6  0.5  0.4   Alkaline Phos 38 - 126 U/L 64  58  53   AST 15 - 41 U/L 22  20  22  ALT 0 - 44 U/L 19  20  21          Edman Circle, MD 05/06/2022, 1:39 PM  [facility]: [facility]

## 2022-05-06 NOTE — Patient Instructions (Addendum)
_______________________________________________________  If your blood pressure at your visit was 140/90 or greater, please contact your primary care physician to follow up on this.  _______________________________________________________  If you are age 45 or older, your body mass index should be between 23-30. Your There is no height or weight on file to calculate BMI. If this is out of the aforementioned range listed, please consider follow up with your Primary Care Provider.  If you are age 74 or younger, your body mass index should be between 19-25. Your There is no height or weight on file to calculate BMI. If this is out of the aformentioned range listed, please consider follow up with your Primary Care Provider.   ________________________________________________________  The [facility] providers would like to encourage you to use [facility] to communicate with providers for non-urgent requests or questions.  Due to long hold times on the telephone, sending your provider a message by [facility] may be a faster and more efficient way to get a response.  Please allow 48 business hours for a response.  Please remember that this is for non-urgent requests.  _______________________________________________________  Your provider has requested that you go to the basement level for lab work before leaving today. Press "B" on the elevator. The lab is located at the first door on the left as you exit the elevator.  Previsit is 06-28-2022 at 1030am on the 2nd floor to check in Colonoscopy date is 08-05-2022 at 7am arrival for an 8am appointment time. You will be getting a 2 day prep  Continue Protonix  Please call with any questions or concerns.  Thank you,  Dr. Lynann Bologna

## 2022-05-12 NOTE — Addendum Note (Signed)
Addended by: Lurline Hare on: 05/12/2022 09:14 AM   Modules accepted: Orders

## 2022-05-13 ENCOUNTER — Telehealth: Payer: Self-pay

## 2022-05-13 DIAGNOSIS — B9681 Helicobacter pylori [H. pylori] as the cause of diseases classified elsewhere: Secondary | ICD-10-CM

## 2022-05-13 NOTE — Telephone Encounter (Signed)
Patient will come Monday after 10 to pick up the kit. She needs to ask for Ann Andrade

## 2022-05-13 NOTE — Telephone Encounter (Signed)
LVM for patient to come by the office today after 330pm today to pick up her stool kit for her to do and take to the lab once she has collected it

## 2022-05-25 NOTE — Telephone Encounter (Signed)
Stool test from [facility] came back positive for Helicobacter pylori and Clarithromycin resistance determinant as well    Patient made aware she was positive for H pylori still. Told her to call [phone] so that she can see Dr Luciana Axe again. A referral has been sent as well and she voiced understanding   Called [phone]  and was transferred to the front and left a voicemail regarding this patient to a Claris Che

## 2022-05-25 NOTE — Addendum Note (Signed)
Addended by: Alberteen Sam E on: 05/25/2022 12:09 PM   Modules accepted: Orders

## 2022-06-08 ENCOUNTER — Ambulatory Visit (INDEPENDENT_AMBULATORY_CARE_PROVIDER_SITE_OTHER): Payer: Commercial Managed Care - HMO | Admitting: Internal Medicine

## 2022-06-08 ENCOUNTER — Other Ambulatory Visit (HOSPITAL_BASED_OUTPATIENT_CLINIC_OR_DEPARTMENT_OTHER): Payer: Self-pay

## 2022-06-08 ENCOUNTER — Encounter: Payer: Self-pay | Admitting: Internal Medicine

## 2022-06-08 ENCOUNTER — Other Ambulatory Visit: Payer: Self-pay

## 2022-06-08 VITALS — BP 104/72 | HR 73 | Temp 98.3°F | Resp 16 | Wt 176.2 lb

## 2022-06-08 DIAGNOSIS — A048 Other specified bacterial intestinal infections: Secondary | ICD-10-CM

## 2022-06-08 MED ORDER — LEVOFLOXACIN 500 MG PO TABS
250.0000 mg | ORAL_TABLET | Freq: Every day | ORAL | 0 refills | Status: AC
  Filled 2022-06-08: qty 7, 14d supply, fill #0

## 2022-06-08 MED ORDER — DOXYCYCLINE HYCLATE 100 MG PO TABS
100.0000 mg | ORAL_TABLET | Freq: Every day | ORAL | 0 refills | Status: AC
  Filled 2022-06-08: qty 14, 14d supply, fill #0

## 2022-06-08 MED ORDER — METRONIDAZOLE 500 MG PO TABS
500.0000 mg | ORAL_TABLET | Freq: Two times a day (BID) | ORAL | 0 refills | Status: AC
  Filled 2022-06-08: qty 28, 14d supply, fill #0

## 2022-06-08 MED ORDER — PANTOPRAZOLE SODIUM 40 MG PO TBEC
80.0000 mg | DELAYED_RELEASE_TABLET | Freq: Every day | ORAL | 0 refills | Status: AC
  Filled 2022-06-08: qty 28, 14d supply, fill #0

## 2022-06-08 NOTE — Assessment & Plan Note (Signed)
Discussed options again with her and pharmacy and pharmacist Jimmy and in review of the literature, there is a good article with a 4 drug regimen - levofloxacin 250 mg once daily, ppi twice a day, doxycycline once a day and metronidazole twice a day (in substitution) with an 89% success rate.  Will try that.  Then follow up stool Ag Follow up in after that  I have personally spent 45 minutes involved in face-to-face and non-face-to-face activities for this patient on the day of the visit. Professional time spent includes the following activities: Preparing to see the patient (review of tests), Obtaining and/or reviewing separately obtained history (admission/discharge record), Performing a medically appropriate examination and/or evaluation , Ordering medications/tests/procedures, referring and communicating with other health care professionals, Documenting clinical information in the EMR.  Particularly also in review of the literature.

## 2022-06-08 NOTE — Patient Instructions (Addendum)
How to take medications for H. Pylori: -levofloxacin 250 mg by mouth once daily  -doxycycline 100 mg by mouth once daily  -pantoprazole 40 mg by mouth two times a day - metronidazole 500 mg by mouht two times daily  Tips: Make sure to take medications with a meal especially doxycycline and metronidazole to avoid any nasuea and to separate out from any calcium containing products by taking the medications two hours before or 6 hours after calcium containing products  Please call: [phone] if any questions about medications or any side effects. We can help manage them and help you finish your course for 14 days.   Thank you!

## 2022-06-08 NOTE — Progress Notes (Signed)
   Subjective:    Patient ID: Ann Andrade, female    DOB: November 03, 1975, 47 y.o.   MRN: [id number]  HPI [name] is here for follow up for H pylori infection. She is followed by Dr. Chales Abrahams and had a positive gastric biopsy for H pylori and given 4-drug treaement  but due to itching, did not take amoxicillin as prescribed then.  Symptoms have been bloating, nausea and reflux.  Her Y pylori was found to be clarithromycin resistant after that.  I had her take tetracycline, bismuth, ppi and metronidazole for 2 weeks but symptoms persisted.  She then tried levofloxacin, doxycycline and pantoprazole for 14 days and felt better but Ag has remained positive.  In further discussion, it did not sound like she was taking the mediations as prescribed.  She was referred back to me in November and discussed other treatment options and she refused to consider retreatment again.  She is referred back again today to reconsider.     Review of Systems  Constitutional:  Negative for fatigue.  Gastrointestinal:  Negative for diarrhea and nausea.  Skin:  Negative for rash.       Objective:   Physical Exam Eyes:     General: No scleral icterus. Pulmonary:     Effort: Pulmonary effort is normal.  Neurological:     Mental Status: She is alert.   SH: no tobacco        Assessment & Plan:

## 2022-06-16 ENCOUNTER — Other Ambulatory Visit (HOSPITAL_BASED_OUTPATIENT_CLINIC_OR_DEPARTMENT_OTHER): Payer: Self-pay

## 2022-06-22 ENCOUNTER — Encounter: Payer: Self-pay | Admitting: Gastroenterology

## 2022-06-28 ENCOUNTER — Other Ambulatory Visit (HOSPITAL_BASED_OUTPATIENT_CLINIC_OR_DEPARTMENT_OTHER): Payer: Self-pay

## 2022-06-28 ENCOUNTER — Ambulatory Visit (AMBULATORY_SURGERY_CENTER): Payer: Commercial Managed Care - HMO

## 2022-06-28 VITALS — Ht 63.0 in | Wt 186.0 lb

## 2022-06-28 DIAGNOSIS — R1030 Lower abdominal pain, unspecified: Secondary | ICD-10-CM

## 2022-06-28 DIAGNOSIS — Z8619 Personal history of other infectious and parasitic diseases: Secondary | ICD-10-CM

## 2022-06-28 DIAGNOSIS — K625 Hemorrhage of anus and rectum: Secondary | ICD-10-CM

## 2022-06-28 DIAGNOSIS — Z1211 Encounter for screening for malignant neoplasm of colon: Secondary | ICD-10-CM

## 2022-06-28 DIAGNOSIS — K581 Irritable bowel syndrome with constipation: Secondary | ICD-10-CM

## 2022-06-28 MED ORDER — NA SULFATE-K SULFATE-MG SULF 17.5-3.13-1.6 GM/177ML PO SOLN
1.0000 | Freq: Once | ORAL | 0 refills | Status: AC
  Filled 2022-06-28: qty 354, 1d supply, fill #0

## 2022-06-28 NOTE — Progress Notes (Signed)
No egg or soy allergy known to patient  No issues known to pt with past sedation with any surgeries or procedures - PONV  Patient denies ever being told they had issues or difficulty with intubation  No FH of Malignant Hyperthermia Pt is not on diet pills Pt is not on  home 02  Pt is not on blood thinners  Pt with hx of constipation taking linzess daily with improvement 2 day prep given  No A fib or A flutter Have any cardiac testing pending--no  Level of assistance:  independent    PV completed. Prep instructions reviewed with patient. Instructions provided in english and spanish per pt request. Sent via mychart and to home address.   Patient's chart reviewed by Cathlyn Parsons CNRA prior to previsit and patient appropriate for the [facility].  Previsit completed and red dot placed by patient's name on their procedure day (on provider's schedule).

## 2022-07-11 ENCOUNTER — Other Ambulatory Visit (HOSPITAL_BASED_OUTPATIENT_CLINIC_OR_DEPARTMENT_OTHER): Payer: Self-pay

## 2022-08-05 ENCOUNTER — Encounter: Payer: Self-pay | Admitting: Gastroenterology

## 2022-08-05 ENCOUNTER — Ambulatory Visit (AMBULATORY_SURGERY_CENTER): Payer: Commercial Managed Care - HMO | Admitting: Gastroenterology

## 2022-08-05 VITALS — BP 116/69 | Resp 14

## 2022-08-05 DIAGNOSIS — Z1211 Encounter for screening for malignant neoplasm of colon: Secondary | ICD-10-CM

## 2022-08-05 DIAGNOSIS — Z538 Procedure and treatment not carried out for other reasons: Secondary | ICD-10-CM

## 2022-08-05 NOTE — Progress Notes (Signed)
Colon report   Provation down  ASA: 2  CRC screening (2nd attempt, first 03/2021- limited d/t prep)  PCF inserted to 25 cm Solid stool  Proc stopped   Plan: -Recommend rpt attempt at colon after 2 day prep. -Miralax 17g po every day    Edman Circle, MD [facility] [phone]

## 2022-08-08 ENCOUNTER — Telehealth: Payer: Self-pay

## 2022-08-08 NOTE — Telephone Encounter (Signed)
Follow up call to pt, no answer. 

## 2022-09-01 ENCOUNTER — Telehealth: Payer: Self-pay

## 2022-09-01 DIAGNOSIS — Z1211 Encounter for screening for malignant neoplasm of colon: Secondary | ICD-10-CM

## 2022-09-01 NOTE — Telephone Encounter (Signed)
Patient said she would like to do an third attempt for the colonoscopy. Scheduled 10-18 and I told her that I would talk with Dr Chales Abrahams regarding her colon prep and will get back to her and she voiced understanding.

## 2022-09-02 MED ORDER — CLENPIQ 10-3.5-12 MG-GM -GM/175ML PO SOLN: 1.0000 | ORAL | 0 refills | Status: DC

## 2022-09-02 NOTE — Telephone Encounter (Signed)
Instructions and prep sent to pharmacy

## 2022-09-05 NOTE — Telephone Encounter (Signed)
LVM for patient to call back to schedule a previsit appointment. I have done her instructions and sent prep in. She will need to do the instructions that I have made due to poor bowel prep 2 times in a row

## 2022-09-07 NOTE — Telephone Encounter (Signed)
LVM again

## 2022-09-08 NOTE — Telephone Encounter (Signed)
LVM again and even recorded my name

## 2022-09-09 ENCOUNTER — Other Ambulatory Visit (HOSPITAL_BASED_OUTPATIENT_CLINIC_OR_DEPARTMENT_OTHER): Payer: Self-pay

## 2022-09-09 MED ORDER — CLENPIQ 10-3.5-12 MG-GM -GM/175ML PO SOLN
1.0000 | ORAL | 0 refills | Status: AC
  Filled 2022-09-09 – 2022-09-26 (×2): qty 350, 1d supply, fill #0

## 2022-09-09 NOTE — Telephone Encounter (Signed)
[facility]. Told her her appointment will be in person and that she will need to pick up the prep and to call if she is having problems with it

## 2022-09-21 ENCOUNTER — Other Ambulatory Visit (HOSPITAL_BASED_OUTPATIENT_CLINIC_OR_DEPARTMENT_OTHER): Payer: Self-pay

## 2022-09-26 ENCOUNTER — Other Ambulatory Visit (HOSPITAL_BASED_OUTPATIENT_CLINIC_OR_DEPARTMENT_OTHER): Payer: Self-pay

## 2022-10-05 ENCOUNTER — Emergency Department (HOSPITAL_BASED_OUTPATIENT_CLINIC_OR_DEPARTMENT_OTHER)
Admission: EM | Admit: 2022-10-05 | Discharge: 2022-10-05 | Disposition: A | Discharge status: Home / Self Care | Payer: Commercial Managed Care - HMO | Attending: Emergency Medicine | Admitting: Emergency Medicine

## 2022-10-05 ENCOUNTER — Encounter (HOSPITAL_BASED_OUTPATIENT_CLINIC_OR_DEPARTMENT_OTHER): Payer: Self-pay | Admitting: Emergency Medicine

## 2022-10-05 ENCOUNTER — Other Ambulatory Visit: Payer: Self-pay

## 2022-10-05 ENCOUNTER — Emergency Department (HOSPITAL_BASED_OUTPATIENT_CLINIC_OR_DEPARTMENT_OTHER): Payer: Commercial Managed Care - HMO

## 2022-10-05 DIAGNOSIS — J45909 Unspecified asthma, uncomplicated: Secondary | ICD-10-CM | POA: Insufficient documentation

## 2022-10-05 DIAGNOSIS — N644 Mastodynia: Secondary | ICD-10-CM | POA: Insufficient documentation

## 2022-10-05 DIAGNOSIS — Z853 Personal history of malignant neoplasm of breast: Secondary | ICD-10-CM | POA: Insufficient documentation

## 2022-10-05 DIAGNOSIS — Z7951 Long term (current) use of inhaled steroids: Secondary | ICD-10-CM | POA: Diagnosis not present

## 2022-10-05 LAB — TROPONIN I (HIGH SENSITIVITY): Troponin I (High Sensitivity): <2 ng/L (ref <18)

## 2022-10-05 LAB — CBC
HCT: 37.6 % (ref 36.0–46.0)
Hemoglobin: 12.8 g/dL (ref 12.0–15.0)
MCH: 30.8 pg (ref 26.0–34.0)
MCHC: 34 g/dL (ref 30.0–36.0)
MCV: 90.6 fL (ref 80.0–100.0)
Platelets: 258 10*3/uL (ref 150–400)
RBC: 4.15 MIL/uL (ref 3.87–5.11)
RDW: 11.5 % (ref 11.5–15.5)
WBC: 5.6 10*3/uL (ref 4.0–10.5)
nRBC: 0 % (ref 0.0–0.2)

## 2022-10-05 LAB — BASIC METABOLIC PANEL WITH GFR
Anion gap: 8 (ref 5–15)
BUN: 11 mg/dL (ref 6–20)
CO2: 25 mmol/L (ref 22–32)
Calcium: 8.7 mg/dL — ABNORMAL LOW (ref 8.9–10.3)
Chloride: 107 mmol/L (ref 98–111)
Creatinine, Ser: 0.55 mg/dL (ref 0.44–1.00)
GFR, Estimated: >60 mL/min (ref >60)
Glucose, Bld: 89 mg/dL (ref 70–99)
Potassium: 3.6 mmol/L (ref 3.5–5.1)
Sodium: 140 mmol/L (ref 135–145)

## 2022-10-05 LAB — PREGNANCY, URINE: Preg Test, Ur: NEGATIVE

## 2022-10-05 MED ORDER — NAPROXEN 250 MG PO TABS
500.0000 mg | ORAL_TABLET | Freq: Once | ORAL | Status: AC
  Administered 2022-10-05: 500 mg via ORAL
  Filled 2022-10-05: qty 2

## 2022-10-05 NOTE — ED Triage Notes (Signed)
Pt to ER with c/o left breast pain that increased with pressure on the breast.  Pt denies chest pain, SHOB.  States pain started 2 days ago.  Denies discharge, redness, swelling or other s/s.

## 2022-10-05 NOTE — Discharge Instructions (Signed)
It was a pleasure caring for you today. Lab work was reassuring.  As discussed, you will need to follow-up with your primary care provider and possibly obtain an MRI for further imaging.  Seek emergency care if experiencing any new or worsening symptoms.

## 2022-10-05 NOTE — ED Provider Notes (Signed)
[facility] Provider Note   CSN: [id number] Arrival date & time: 10/05/22  1400     History {Add pertinent medical, surgical, social history, OB history to HPI:1} Chief Complaint  Patient presents with   Breast Pain    Maritza Mongan is a 47 y.o. female with PMHx anxiety, asthma, and breast cancer s/p left mastectomy and fat graft breast reconstruction in 2011 who presents to ED concerned for left sided breast pain x5 days. Patient has taken Tylenol and Advil without relief. Pain does not radiate outside of the left breast. Pain increased with deep inspiration.  Denies fever, chest pain, dyspnea, cough, nausea. Denies recent surgery/immobilization, hx DVT/PE, hemoptysis, hx cancer in the past 6 months, calf swelling/tenderness.    HPI     Home Medications Prior to Admission medications   Medication Sig Start Date End Date Taking? Authorizing Provider  albuterol (VENTOLIN HFA) 108 (90 Base) MCG/ACT inhaler Inhale 2 puffs into the lungs every 6 (six) hours as needed for wheezing or shortness of breath. 10/25/21   Verlee Monte, MD  Albuterol-Budesonide [facility]) 90-80 MCG/ACT AERO Inhale 2 puffs into the lungs as needed. Maximum 12 puffs/day 11/22/21   Verlee Monte, MD  cromolyn (OPTICROM) 4 % ophthalmic solution Place 1 drop into both eyes 4 (four) times daily as needed. 11/22/21   Verlee Monte, MD  dicyclomine (BENTYL) 20 MG tablet Take 1 tablet (20 mg total) by mouth 3 (three) times daily as needed for spasms. 11/19/21   Doree Albee, PA-C  doxycycline (VIBRA-TABS) 100 MG tablet Take 1 tablet (100 mg total) by mouth daily. 06/08/22   Comer, Belia Heman, MD  levocetirizine (XYZAL) 5 MG tablet Take 1 tablet (5 mg total) by mouth every evening. 10/25/21   Verlee Monte, MD  levofloxacin (LEVAQUIN) 500 MG tablet Take 0.5 tablets (250 mg total) by mouth daily. 06/08/22   Gardiner Barefoot, MD  linaclotide [facility]) 72 MCG capsule Take 1 capsule  (72 mcg total) by mouth daily before breakfast. 01/20/22   Lynann Bologna, MD  metroNIDAZOLE (FLAGYL) 500 MG tablet Take 1 tablet (500 mg total) by mouth 2 (two) times daily. 06/08/22   Gardiner Barefoot, MD  montelukast (SINGULAIR) 10 MG tablet Take 1 tablet (10 mg total) by mouth at bedtime. 10/25/21   Verlee Monte, MD  pantoprazole (PROTONIX) 40 MG tablet Take 1 tablet (40 mg total) by mouth daily. 01/20/22   Lynann Bologna, MD  pantoprazole (PROTONIX) 40 MG tablet Take 2 tablets (80 mg total) by mouth daily. 06/08/22   Comer, Belia Heman, MD  prochlorperazine (COMPAZINE) 10 MG tablet Take 1 tablet (10 mg total) by mouth every 6 (six) hours as needed for nausea or vomiting. 10/31/21   Long, Arlyss Repress, MD  Sod Picosulfate-Mag Ox-Cit Acd (CLENPIQ) 10-3.5-12 MG-GM -GM/175ML SOLN Take 1 kit by mouth as directed. 09/09/22   Lynann Bologna, MD  traMADol (ULTRAM) 50 MG tablet Take 1 tablet (50 mg total) by mouth every 6 (six) hours as needed. 12/21/21 12/21/22  Axel Filler, MD      Allergies    Patient has no allergy information on record.    Review of Systems   Review of Systems  Physical Exam Updated Vital Signs BP 103/72 (BP Location: Left Arm)   Pulse 75   Temp 97.8 F (36.6 C)   Resp 20   Ht 5\' 3"  (1.6 m)   Wt 71.7 kg   SpO2 99%  BMI 27.99 kg/m  Physical Exam  ED Results / Procedures / Treatments   Labs (all labs ordered are listed, but only abnormal results are displayed) Labs Reviewed - No data to display  EKG None  Radiology No results found.  Procedures Procedures  {Document cardiac monitor, telemetry assessment procedure when appropriate:1}  Medications Ordered in ED Medications - No data to display  ED Course/ Medical Decision Making/ A&P   {   Click here for ABCD2, HEART and other calculatorsREFRESH Note before signing :1}                              Medical Decision Making  ***  Patient requesting to go home. States that she can follow up with surgeon and  PCP.  {Document critical care time when appropriate:1} {Document review of labs and clinical decision tools ie heart score, Chads2Vasc2 etc:1}  {Document your independent review of radiology images, and any outside records:1} {Document your discussion with family members, caretakers, and with consultants:1} {Document social determinants of health affecting pt's care:1} {Document your decision making why or why not admission, treatments were needed:1} Final Clinical Impression(s) / ED Diagnoses Final diagnoses:  None    Rx / DC Orders ED Discharge Orders     None

## 2022-10-05 NOTE — ED Notes (Signed)
Pt. Did not want to ambulate and states pain meds did not help her.

## 2022-10-13 ENCOUNTER — Other Ambulatory Visit (HOSPITAL_BASED_OUTPATIENT_CLINIC_OR_DEPARTMENT_OTHER): Payer: Self-pay

## 2022-10-13 MED ORDER — ALBUTEROL SULFATE HFA 108 (90 BASE) MCG/ACT IN AERS
2.0000 | INHALATION_SPRAY | RESPIRATORY_TRACT | 3 refills | Status: AC
  Filled 2022-10-13: qty 6.7, 25d supply, fill #0

## 2022-10-13 MED ORDER — IBUPROFEN 800 MG PO TABS
800.0000 mg | ORAL_TABLET | Freq: Three times a day (TID) | ORAL | 0 refills | Status: DC
  Filled 2022-10-13: qty 90, 30d supply, fill #0

## 2022-10-13 MED ORDER — CITALOPRAM HYDROBROMIDE 20 MG PO TABS
20.0000 mg | ORAL_TABLET | Freq: Every day | ORAL | 4 refills | Status: AC
  Filled 2022-10-13: qty 30, 30d supply, fill #0

## 2022-10-13 MED ORDER — MEDROXYPROGESTERONE ACETATE 104 MG/0.65ML ~~LOC~~ SUSY
104.0000 mg | PREFILLED_SYRINGE | SUBCUTANEOUS | 4 refills | Status: AC
Start: 2022-10-13 — End: ?
  Filled 2022-10-13: qty 0.65, 1d supply, fill #0
  Filled 2022-11-08: qty 0.65, 84d supply, fill #0

## 2022-10-24 ENCOUNTER — Encounter: Payer: Self-pay | Admitting: Gastroenterology

## 2022-10-27 ENCOUNTER — Ambulatory Visit: Payer: Managed Care, Other (non HMO)

## 2022-10-27 VITALS — Ht 63.0 in | Wt 155.0 lb

## 2022-10-27 DIAGNOSIS — Z1211 Encounter for screening for malignant neoplasm of colon: Secondary | ICD-10-CM

## 2022-10-27 NOTE — Progress Notes (Signed)
Patient did not show for in person PV. Pt r/s prior due to not showing. PV completed by phone. Pt said she already has all prep supplies at home and no questions regarding her prep at the end of the call,.   No egg or soy allergy known to patient  No issues known to pt with past sedation with any surgeries or procedures Patient denies ever being told they had issues or difficulty with intubation  No FH of Malignant Hyperthermia Pt is not on diet pills Pt is not on  home 02  Pt is not on blood thinners  Pt denies issues with constipation  No A fib or A flutter Have any cardiac testing pending--no LOA: independent  Prep: instructions given at OV  Patient's chart reviewed by Cathlyn Parsons CNRA prior to previsit and patient appropriate for the [facility].  Previsit completed and red dot placed by patient's name on their procedure day (on provider's schedule).     PV competed with patient. Prep instructions sent via mychart patient already has a hard copy at home

## 2022-10-28 ENCOUNTER — Other Ambulatory Visit (HOSPITAL_BASED_OUTPATIENT_CLINIC_OR_DEPARTMENT_OTHER): Payer: Self-pay

## 2022-11-04 ENCOUNTER — Telehealth: Payer: Self-pay | Admitting: Gastroenterology

## 2022-11-04 ENCOUNTER — Encounter: Payer: Managed Care, Other (non HMO) | Admitting: Gastroenterology

## 2022-11-04 NOTE — Telephone Encounter (Signed)
Good Morning Dr. Chales Abrahams  I called this patient and left a vm on her home phone that if she needed to reschedule to please call us.  At 8:47 last night she respond "NO"  to the automated reminder.     I will NO SHOW patient - Ann Andrade

## 2022-11-08 ENCOUNTER — Other Ambulatory Visit (HOSPITAL_BASED_OUTPATIENT_CLINIC_OR_DEPARTMENT_OTHER): Payer: Self-pay

## 2022-11-09 ENCOUNTER — Other Ambulatory Visit (HOSPITAL_BASED_OUTPATIENT_CLINIC_OR_DEPARTMENT_OTHER): Payer: Self-pay

## 2022-11-10 ENCOUNTER — Other Ambulatory Visit (HOSPITAL_BASED_OUTPATIENT_CLINIC_OR_DEPARTMENT_OTHER): Payer: Self-pay

## 2022-12-29 ENCOUNTER — Other Ambulatory Visit (HOSPITAL_BASED_OUTPATIENT_CLINIC_OR_DEPARTMENT_OTHER): Payer: Self-pay

## 2023-02-13 ENCOUNTER — Other Ambulatory Visit: Payer: Self-pay | Admitting: Gastroenterology

## 2023-02-13 ENCOUNTER — Other Ambulatory Visit (HOSPITAL_BASED_OUTPATIENT_CLINIC_OR_DEPARTMENT_OTHER): Payer: Self-pay

## 2023-02-14 ENCOUNTER — Other Ambulatory Visit (HOSPITAL_BASED_OUTPATIENT_CLINIC_OR_DEPARTMENT_OTHER): Payer: Self-pay

## 2023-02-14 MED ORDER — LINACLOTIDE 72 MCG PO CAPS
72.0000 ug | ORAL_CAPSULE | Freq: Every day | ORAL | 0 refills | Status: DC
  Filled 2023-02-14 – 2023-03-30 (×3): qty 90, 90d supply, fill #0

## 2023-02-15 ENCOUNTER — Other Ambulatory Visit (HOSPITAL_BASED_OUTPATIENT_CLINIC_OR_DEPARTMENT_OTHER): Payer: Self-pay

## 2023-03-30 ENCOUNTER — Other Ambulatory Visit (HOSPITAL_BASED_OUTPATIENT_CLINIC_OR_DEPARTMENT_OTHER): Payer: Self-pay

## 2023-05-18 ENCOUNTER — Encounter: Payer: Self-pay | Admitting: Gastroenterology

## 2023-06-15 ENCOUNTER — Other Ambulatory Visit: Payer: Self-pay | Admitting: Gastroenterology

## 2023-06-15 ENCOUNTER — Other Ambulatory Visit (HOSPITAL_BASED_OUTPATIENT_CLINIC_OR_DEPARTMENT_OTHER): Payer: Self-pay

## 2023-06-15 MED ORDER — LINACLOTIDE 72 MCG PO CAPS
72.0000 ug | ORAL_CAPSULE | Freq: Every day | ORAL | 0 refills | Status: AC
  Filled 2023-06-15: qty 90, 90d supply, fill #0

## 2023-06-19 ENCOUNTER — Other Ambulatory Visit (HOSPITAL_BASED_OUTPATIENT_CLINIC_OR_DEPARTMENT_OTHER): Payer: Self-pay

## 2023-07-25 IMAGING — CT CT ABD-PELV W/ CM
2 of 5 series · 16 of 46 positions shown, 18 images · IV contrast (Omnipaque)
Comparison: None.

CLINICAL DATA: Diverticulitis, rhinorrhea, chills, myalgia

EXAM:
CT ABDOMEN AND PELVIS WITH CONTRAST
TECHNIQUE: Multidetector CT imaging of the abdomen and pelvis was performed
using the standard protocol following bolus administration of
intravenous contrast.
CONTRAST:  100mL OMNIPAQUE IOHEXOL 350 MG/ML SOLN

[Series 2: axial st · axial · 0.94mm/px · z∈[-669,-249]mm · 13 of 94 slices shown, 15 images]
[im 5/94  soft-tissue]
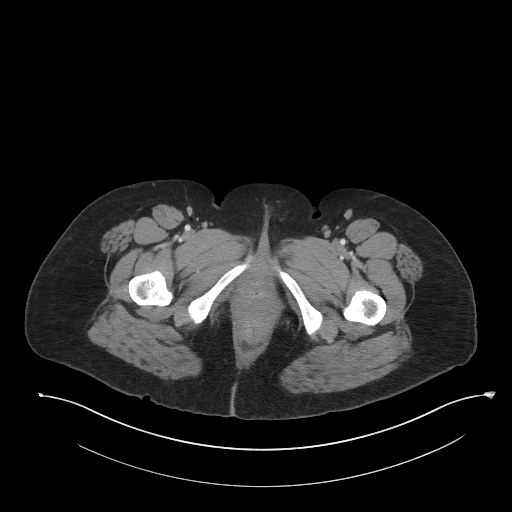
[im 5/94  bone]
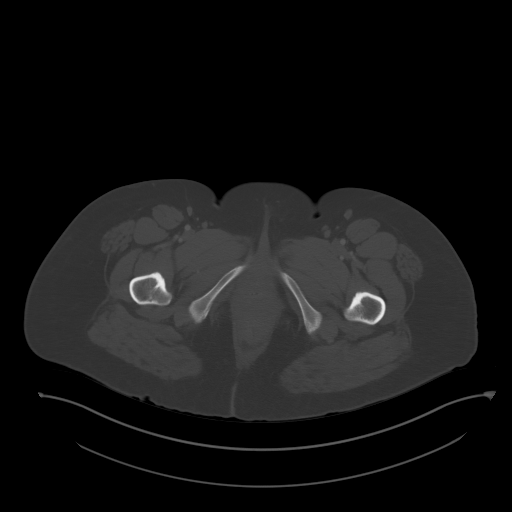
[im 15/94  soft-tissue]
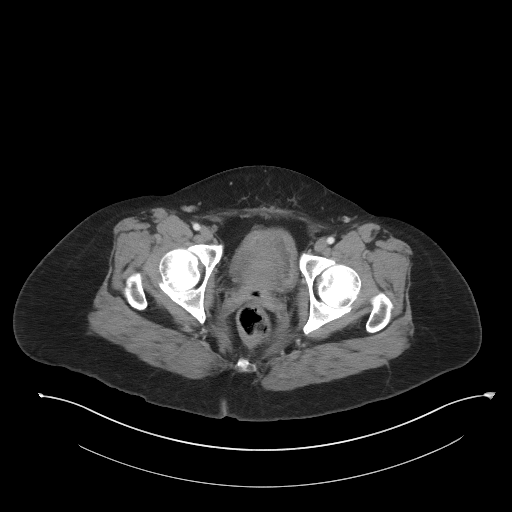
[im 20/94  soft-tissue]
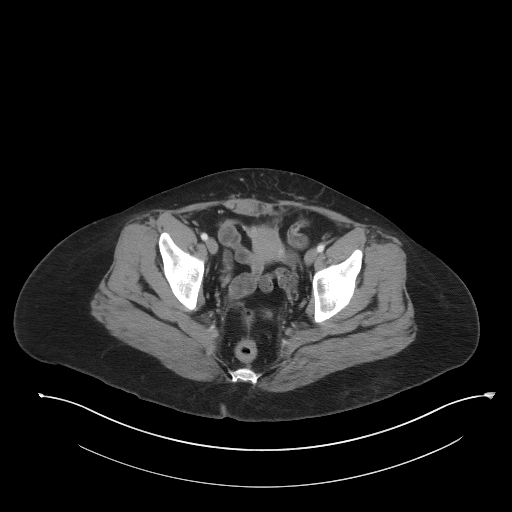
[im 25/94  soft-tissue]
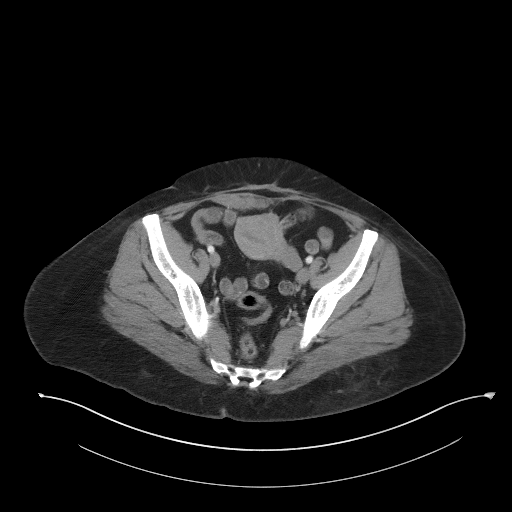
[im 35/94  soft-tissue]
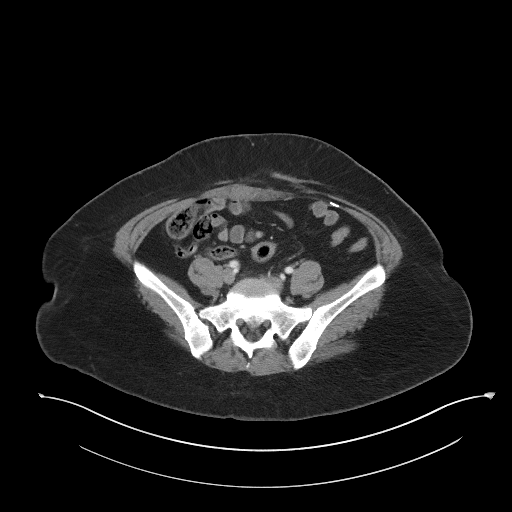
[im 40/94  soft-tissue]
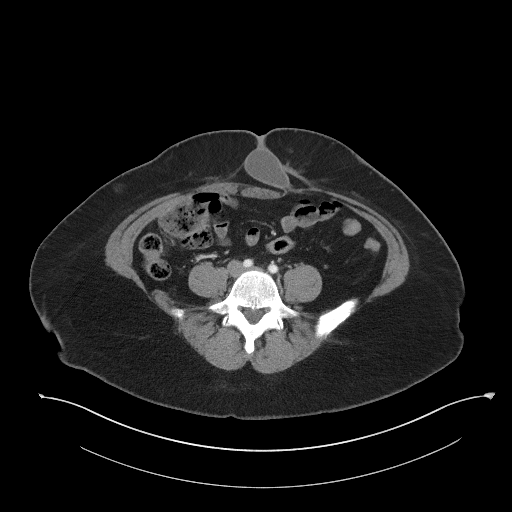
[im 49/94  soft-tissue]
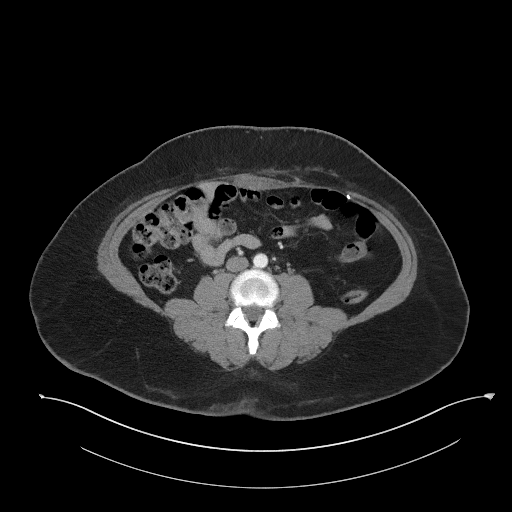
[im 54/94  soft-tissue]
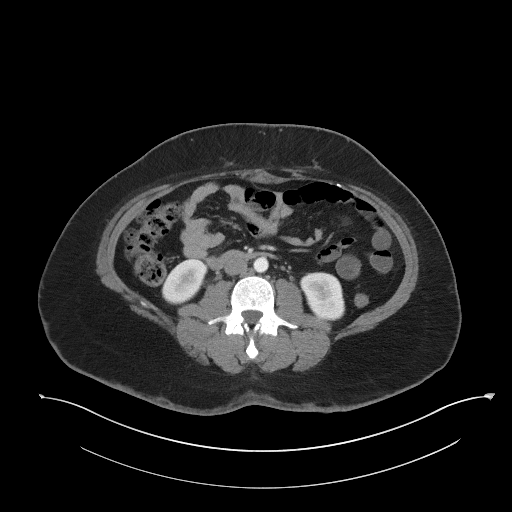
[im 59/94  soft-tissue]
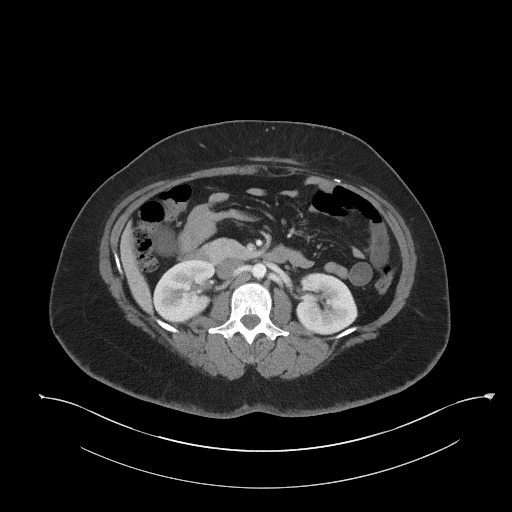
[im 59/94  bone]
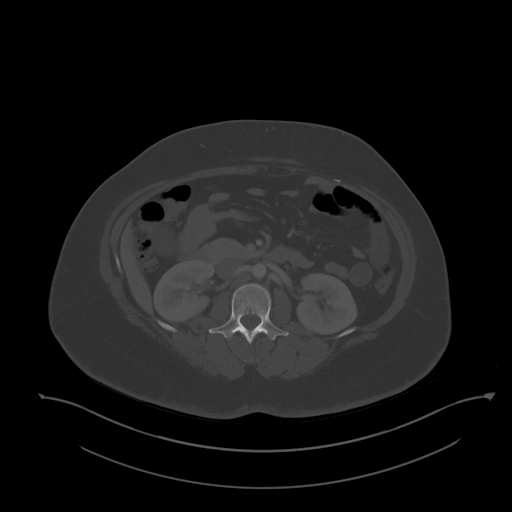
[im 69/94  soft-tissue]
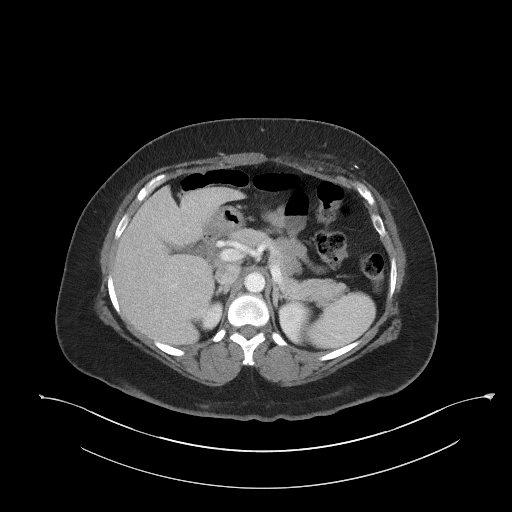
[im 74/94  soft-tissue]
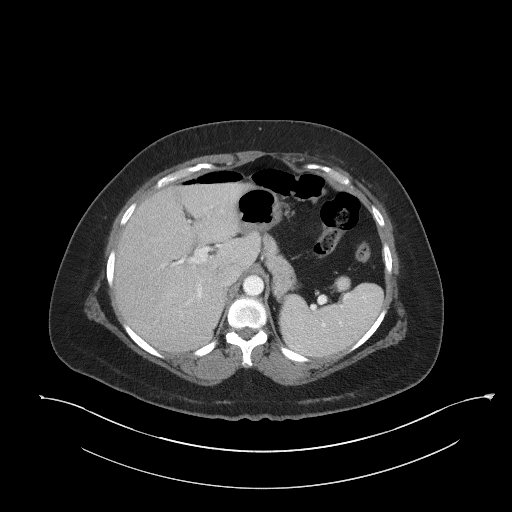
[im 79/94  soft-tissue]
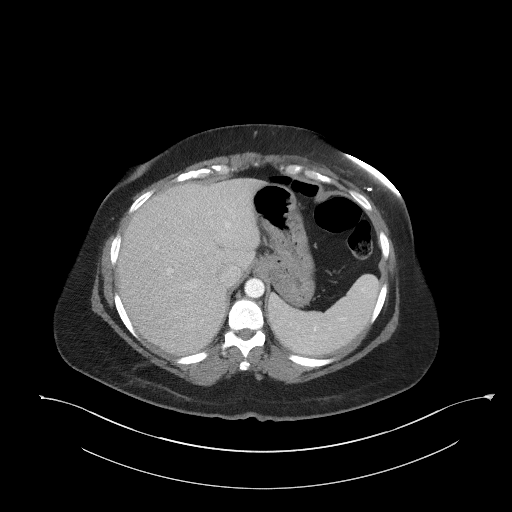
[im 89/94  soft-tissue]
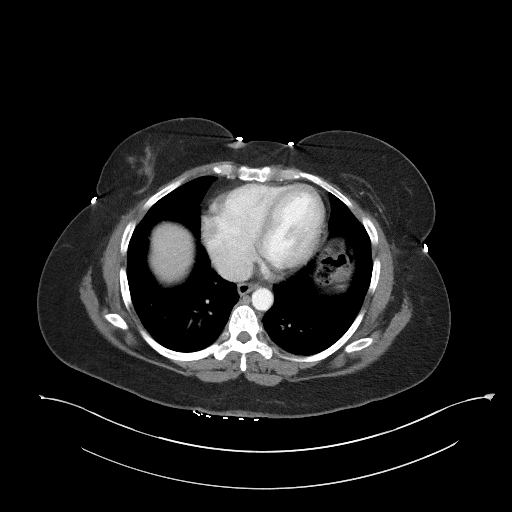

[Series 5: coronal st · coronal · 0.90mm/px · 3 of 100 slices shown]
[im 34/100  soft-tissue]
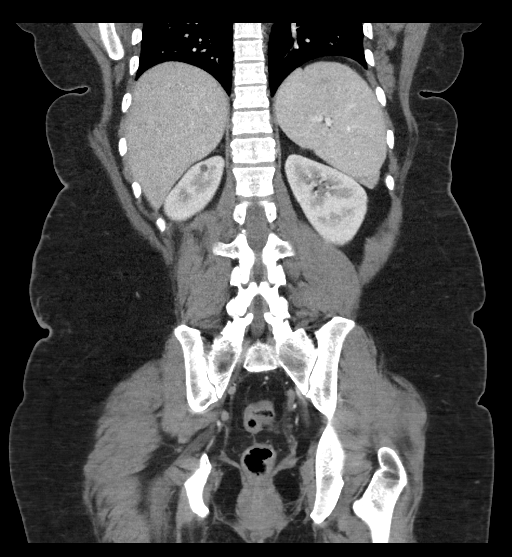
[im 45/100  soft-tissue]
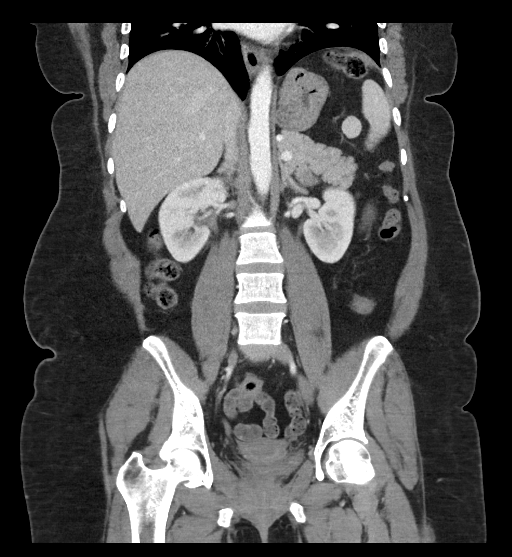
[im 56/100  soft-tissue]
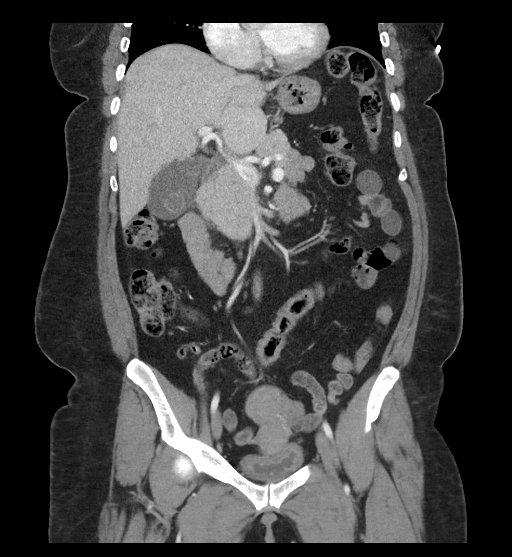

[16 of 46 positions shown; findings below may reference images not displayed]

FINDINGS: Lower chest: No acute abnormality.

Hepatobiliary: Cholelithiasis without pericholecystic inflammatory
change. Mild focal fatty hepatic infiltration adjacent to the
falciform ligament. No enhancing intrahepatic mass. No intra or
extrahepatic biliary ductal dilation.

Pancreas: Unremarkable

Spleen: Unremarkable

Adrenals/Urinary Tract: Adrenal glands are unremarkable. Kidneys are
normal, without renal calculi, focal lesion, or hydronephrosis. The
bladder is decompressed. There is circumferential bladder wall
thickening, however, which may reflect a superimposed diffuse
infectious or inflammatory cystitis.

Stomach/Bowel: Stomach is within normal limits. Appendix appears
normal. No evidence of bowel wall thickening, distention, or
inflammatory changes. No free intraperitoneal gas or fluid.

Vascular/Lymphatic: No significant vascular findings are present. No
enlarged abdominal or pelvic lymph nodes.

Reproductive: Uterus and bilateral adnexa are unremarkable.

Other: Surgical changes of a left breast TRAM flap reconstruction is
identified. A 2.8 x 4.5 cm loculated fluid collection is seen within
the anterior abdominal wall likely representing a postoperative
seroma. No abdominal wall hernia identified. Rectum unremarkable.

Musculoskeletal: No acute bone abnormality. No lytic or blastic bone
lesions.
IMPRESSION: Circumferential bladder wall thickening, possibly reflecting a
diffuse infectious or inflammatory cystitis. Correlation with
urinalysis and urine culture may be helpful. The bladder is not
distended.

Cholelithiasis.

Surgical changes of left breast TRAM flap reconstruction with
probable 4.5 cm postoperative seroma within the anterior abdominal
wall.

## 2023-10-31 ENCOUNTER — Other Ambulatory Visit (HOSPITAL_BASED_OUTPATIENT_CLINIC_OR_DEPARTMENT_OTHER): Payer: Self-pay

## 2023-10-31 ENCOUNTER — Other Ambulatory Visit: Payer: Self-pay | Admitting: Gastroenterology

## 2023-12-04 ENCOUNTER — Other Ambulatory Visit: Payer: Self-pay | Admitting: Gastroenterology

## 2023-12-04 ENCOUNTER — Other Ambulatory Visit (HOSPITAL_BASED_OUTPATIENT_CLINIC_OR_DEPARTMENT_OTHER): Payer: Self-pay

## 2023-12-06 ENCOUNTER — Other Ambulatory Visit (HOSPITAL_BASED_OUTPATIENT_CLINIC_OR_DEPARTMENT_OTHER): Payer: Self-pay

## 2023-12-13 ENCOUNTER — Other Ambulatory Visit (HOSPITAL_BASED_OUTPATIENT_CLINIC_OR_DEPARTMENT_OTHER): Payer: Self-pay

## 2023-12-13 MED ORDER — LINZESS 72 MCG PO CAPS
72.0000 ug | ORAL_CAPSULE | ORAL | 6 refills | Status: AC
  Filled 2023-12-13: qty 30, 30d supply, fill #0
  Filled 2024-01-22: qty 30, 30d supply, fill #1

## 2023-12-15 ENCOUNTER — Other Ambulatory Visit (HOSPITAL_BASED_OUTPATIENT_CLINIC_OR_DEPARTMENT_OTHER): Payer: Self-pay

## 2024-01-21 ENCOUNTER — Emergency Department (HOSPITAL_BASED_OUTPATIENT_CLINIC_OR_DEPARTMENT_OTHER)

## 2024-01-21 ENCOUNTER — Other Ambulatory Visit: Payer: Self-pay

## 2024-01-21 ENCOUNTER — Encounter (HOSPITAL_BASED_OUTPATIENT_CLINIC_OR_DEPARTMENT_OTHER): Payer: Self-pay | Admitting: Emergency Medicine

## 2024-01-21 ENCOUNTER — Emergency Department (HOSPITAL_BASED_OUTPATIENT_CLINIC_OR_DEPARTMENT_OTHER)
Admission: EM | Admit: 2024-01-21 | Discharge: 2024-01-21 | Disposition: A | Discharge status: Home / Self Care | Source: Home / Self Care | Attending: Emergency Medicine | Admitting: Emergency Medicine

## 2024-01-21 DIAGNOSIS — M79601 Pain in right arm: Secondary | ICD-10-CM | POA: Diagnosis present

## 2024-01-21 DIAGNOSIS — J45909 Unspecified asthma, uncomplicated: Secondary | ICD-10-CM | POA: Diagnosis not present

## 2024-01-21 DIAGNOSIS — Z853 Personal history of malignant neoplasm of breast: Secondary | ICD-10-CM | POA: Insufficient documentation

## 2024-01-21 DIAGNOSIS — M79602 Pain in left arm: Secondary | ICD-10-CM | POA: Diagnosis not present

## 2024-01-21 DIAGNOSIS — R202 Paresthesia of skin: Secondary | ICD-10-CM | POA: Diagnosis not present

## 2024-01-21 LAB — CBC
HCT: 33.9 % — ABNORMAL LOW (ref 36.0–46.0)
Hemoglobin: 11.7 g/dL — ABNORMAL LOW (ref 12.0–15.0)
MCH: 31 pg (ref 26.0–34.0)
MCHC: 34.5 g/dL (ref 30.0–36.0)
MCV: 89.9 fL (ref 80.0–100.0)
Platelets: 228 K/uL (ref 150–400)
RBC: 3.77 MIL/uL — ABNORMAL LOW (ref 3.87–5.11)
RDW: 11.8 % (ref 11.5–15.5)
WBC: 4.9 K/uL (ref 4.0–10.5)
nRBC: 0 % (ref 0.0–0.2)

## 2024-01-21 LAB — TROPONIN T, HIGH SENSITIVITY: Troponin T High Sensitivity: <15 ng/L (ref 0–19)

## 2024-01-21 LAB — BASIC METABOLIC PANEL WITH GFR
Anion gap: 11 (ref 5–15)
BUN: 9 mg/dL (ref 6–20)
CO2: 24 mmol/L (ref 22–32)
Calcium: 9.2 mg/dL (ref 8.9–10.3)
Chloride: 105 mmol/L (ref 98–111)
Creatinine, Ser: 0.53 mg/dL (ref 0.44–1.00)
GFR, Estimated: >60 mL/min
Glucose, Bld: 108 mg/dL — ABNORMAL HIGH (ref 70–99)
Potassium: 3.7 mmol/L (ref 3.5–5.1)
Sodium: 139 mmol/L (ref 135–145)

## 2024-01-21 LAB — CK: Total CK: 209 U/L (ref 38–234)

## 2024-01-21 MED ORDER — NAPROXEN 250 MG PO TABS
500.0000 mg | ORAL_TABLET | Freq: Once | ORAL | Status: AC
  Administered 2024-01-21: 500 mg via ORAL
  Filled 2024-01-21: qty 2

## 2024-01-21 MED ORDER — NAPROXEN 500 MG PO TABS: 500.0000 mg | ORAL_TABLET | Freq: Two times a day (BID) | ORAL | 0 refills | Status: AC

## 2024-01-21 NOTE — Discharge Instructions (Signed)
 You were evaluated in the Emergency Department and after careful evaluation, we did not find any emergent condition requiring admission or further testing in the hospital.  Your exam/testing today is overall reassuring.  Recommend follow-up with your primary care doctor to discuss your symptoms.  Use the Naprosyn  anti-inflammatory twice daily for pain.  Your primary care doctor may consider follow-up with a specialist.  Please return to the Emergency Department if you experience any worsening of your condition.   Thank you for allowing us  to be a part of your care.

## 2024-01-21 NOTE — ED Triage Notes (Addendum)
 Reports BIL arm pain x 2 days. Denies other sx. Reports pins and needle sensation in both arms equally, no change in strength. No sx in LE. Denies neck problems.

## 2024-01-21 NOTE — ED Provider Notes (Signed)
 " [facility] Emergency Department Provider Note MRN:  [id number]  Arrival date & time: 01/21/2024     Chief Complaint   Arm Pain   History of Present Illness   Ann Andrade is a 49 y.o. year-old female with no pertinent past medical history presenting to the ED with chief complaint of arm pain.  Bilateral arm pain over the past few days.  Worst in the upper arms but pain radiates down both arms.  Also having some occasional tingling to the hands bilaterally.  Denies any neck pain, no trauma, no new activities.  Review of Systems  A thorough review of systems was obtained and all systems are negative except as noted in the HPI and PMH.   Patient's Health History    Past Medical History:  Diagnosis Date   Anemia    Anxiety    Asthma    Breast cancer (HCC)    Cholelithiasis    Complication of anesthesia    H. pylori infection    Internal hemorrhoids    PONV (postoperative nausea and vomiting)     Past Surgical History:  Procedure Laterality Date   c setion     x2   CHOLECYSTECTOMY N/A 12/21/2021   Procedure: LAPAROSCOPIC CHOLECYSTECTOMY;  Surgeon: Rubin Calamity, MD;  Location: [facility] OR;  Service: General;  Laterality: N/A;   MASTECTOMY Left    TUBAL LIGATION      Family History  Problem Relation Age of Onset   Colon cancer Neg Hx    Esophageal cancer Neg Hx    Rectal cancer Neg Hx    Stomach cancer Neg Hx     Social History   Socioeconomic History   Marital status: Married    Spouse name: Not on file   Number of children: Not on file   Years of education: Not on file   Highest education level: Not on file  Occupational History   Not on file  Tobacco Use   Smoking status: Never   Smokeless tobacco: Never  Vaping Use   Vaping status: Never Used  Substance and Sexual Activity   Alcohol use: Never   Drug use: Never   Sexual activity: Not on file    Comment: tubeal ligation  Other Topics Concern   Not on file  Social History  Narrative   Not on file   Social Drivers of Health   Tobacco Use: Low Risk (01/21/2024)   Patient History    Smoking Tobacco Use: Never    Smokeless Tobacco Use: Never    Passive Exposure: Not on file  Financial Resource Strain: Not on file  Food Insecurity: Not on file  Transportation Needs: Not on file  Physical Activity: Not on file  Stress: Not on file  Social Connections: Not on file  Intimate Partner Violence: Not on file  Depression (PHQ2-9): Low Risk (06/08/2022)   Depression (PHQ2-9)    PHQ-2 Score: 0  Alcohol Screen: Not on file  Housing: Not on file  Utilities: Not on file  Health Literacy: Not on file     Physical Exam   Vitals:   01/21/24 0400 01/21/24 0430  BP: 106/76 101/80  Pulse: 65 69  Resp: 13 15  Temp:    SpO2: 99% 96%    CONSTITUTIONAL: Well-appearing, NAD NEURO/PSYCH:  Alert and oriented x 3, no focal deficits EYES:  eyes equal and reactive ENT/NECK:  no LAD, no JVD CARDIO: Regular rate, well-perfused, normal S1 and S2 PULM:  CTAB no  wheezing or rhonchi GI/GU:  non-distended, non-tender MSK/SPINE:  No gross deformities, no edema SKIN:  no rash, atraumatic   *Additional and/or pertinent findings included in MDM below  Diagnostic and Interventional Summary    EKG Interpretation Date/Time:  Sunday January 21 2024 03:25:03 EST Ventricular Rate:  72 PR Interval:  125 QRS Duration:  90 QT Interval:  391 QTC Calculation: 428 R Axis:   33  Text Interpretation: Sinus rhythm Abnormal R-wave progression, early transition Confirmed by Camisha Srey ([id number]) on 01/21/2024 3:33:53 AM       Labs Reviewed  CBC - Abnormal; Notable for the following components:      Result Value   RBC 3.77 (*)    Hemoglobin 11.7 (*)    HCT 33.9 (*)    All other components within normal limits  BASIC METABOLIC PANEL WITH GFR - Abnormal; Notable for the following components:   Glucose, Bld 108 (*)    All other components within normal limits  CK  TROPONIN T,  HIGH SENSITIVITY    CT Cervical Spine Wo Contrast  Final Result      Medications  naproxen (NAPROSYN) tablet 500 mg (500 mg Oral Given 01/21/24 0253)     Procedures  /  Critical Care Procedures  ED Course and Medical Decision Making  Initial Impression and Ddx Normal range of motion to the arms bilaterally, neurovascularly intact, does not seem to have muscular pain or tenderness.  No neck pain but suspicious for cervical radiculopathy.  Less likely referred cardiac pain.  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  Past medical/surgical history that increases complexity of ED encounter: None  Interpretation of Diagnostics I personally reviewed the Laboratory Testing and my interpretation is as follows: No significant blood count or electrolyte disturbance.  I also personally reviewed the EKG that showed some nonspecific findings.  Likely benign early repolarization.  Troponin negative.  Patient Reassessment and Ultimate Disposition/Management     Patient resting comfortably on reassessment, continues to show no evidence of obvious myelopathy.  Radiculopathy still a possibility as it is general muscular pain.  Patient will follow-up with primary care doctor for future management.  No emergent process discharged home.  Patient management required discussion with the following services or consulting groups:  None  Complexity of Problems Addressed Acute illness or injury that poses threat of life of bodily function  Additional Data Reviewed and Analyzed Further history obtained from: Further history from spouse/family member  Additional Factors Impacting ED Encounter Risk Consideration of hospitalization  Kalil Woessner M. Clifton Kovacic, MD [facility] [email or web address]  Final Clinical Impressions(s) / ED Diagnoses     ICD-10-CM   1. Pain in both upper extremities  M79.601    M79.602       ED  Discharge Orders          Ordered    naproxen (NAPROSYN) 500 MG tablet  2 times daily        01 /04/26 0448             Discharge Instructions Discussed with and Provided to Patient:     Discharge Instructions      You were evaluated in the Emergency Department and after careful evaluation, we did not find any emergent condition requiring admission or further testing in the hospital.  Your exam/testing today is overall reassuring.  Recommend follow-up with your primary care doctor to discuss your symptoms.  Use the Naprosyn   anti-inflammatory twice daily for pain.  Your primary care doctor may consider follow-up with a specialist.  Please return to the Emergency Department if you experience any worsening of your condition.   Thank you for allowing us  to be a part of your care.       Theadore Ozell HERO, MD 01/21/24 [phone]  "

## 2024-01-22 ENCOUNTER — Ambulatory Visit: Admitting: Gastroenterology

## 2024-01-22 ENCOUNTER — Other Ambulatory Visit (HOSPITAL_BASED_OUTPATIENT_CLINIC_OR_DEPARTMENT_OTHER): Payer: Self-pay
